# Patient Record
Sex: Male | Born: 1955 | Race: Black or African American | Hispanic: No | Marital: Married | Smoking: Current every day smoker
Health system: Southern US, Community
[De-identification: ages and names within clinical notes are randomized; demographics above are authoritative.]

## PROBLEM LIST (undated history)

## (undated) DIAGNOSIS — T4145XA Adverse effect of unspecified anesthetic, initial encounter: Secondary | ICD-10-CM

## (undated) DIAGNOSIS — R0602 Shortness of breath: Secondary | ICD-10-CM

## (undated) DIAGNOSIS — G4730 Sleep apnea, unspecified: Secondary | ICD-10-CM

## (undated) DIAGNOSIS — J449 Chronic obstructive pulmonary disease, unspecified: Secondary | ICD-10-CM

## (undated) DIAGNOSIS — G4700 Insomnia, unspecified: Secondary | ICD-10-CM

## (undated) DIAGNOSIS — T8859XA Other complications of anesthesia, initial encounter: Secondary | ICD-10-CM

## (undated) DIAGNOSIS — K219 Gastro-esophageal reflux disease without esophagitis: Secondary | ICD-10-CM

## (undated) DIAGNOSIS — E785 Hyperlipidemia, unspecified: Secondary | ICD-10-CM

## (undated) DIAGNOSIS — I10 Essential (primary) hypertension: Secondary | ICD-10-CM

## (undated) DIAGNOSIS — F419 Anxiety disorder, unspecified: Secondary | ICD-10-CM

## (undated) HISTORY — DX: Morbid (severe) obesity due to excess calories: E66.01

## (undated) HISTORY — PX: JOINT REPLACEMENT: SHX530

## (undated) HISTORY — DX: Insomnia, unspecified: G47.00

## (undated) HISTORY — PX: BACK SURGERY: SHX140

---

## 1999-09-29 ENCOUNTER — Emergency Department (HOSPITAL_COMMUNITY): Admission: EM | Admit: 1999-09-29 | Discharge: 1999-09-29 | Payer: Self-pay | Admitting: Emergency Medicine

## 1999-11-02 ENCOUNTER — Encounter: Admission: RE | Admit: 1999-11-02 | Discharge: 1999-11-02 | Payer: Self-pay | Admitting: *Deleted

## 1999-11-09 ENCOUNTER — Encounter: Payer: Self-pay | Admitting: General Surgery

## 1999-11-10 ENCOUNTER — Ambulatory Visit (HOSPITAL_COMMUNITY): Admission: RE | Admit: 1999-11-10 | Discharge: 1999-11-11 | Payer: Self-pay | Admitting: General Surgery

## 1999-11-10 ENCOUNTER — Encounter (INDEPENDENT_AMBULATORY_CARE_PROVIDER_SITE_OTHER): Payer: Self-pay | Admitting: *Deleted

## 2001-06-08 ENCOUNTER — Encounter: Payer: Self-pay | Admitting: Emergency Medicine

## 2001-06-08 ENCOUNTER — Emergency Department (HOSPITAL_COMMUNITY): Admission: EM | Admit: 2001-06-08 | Discharge: 2001-06-08 | Payer: Self-pay | Admitting: Emergency Medicine

## 2001-10-03 ENCOUNTER — Emergency Department (HOSPITAL_COMMUNITY): Admission: EM | Admit: 2001-10-03 | Discharge: 2001-10-03 | Payer: Self-pay | Admitting: *Deleted

## 2002-03-12 ENCOUNTER — Other Ambulatory Visit (HOSPITAL_COMMUNITY): Admission: RE | Admit: 2002-03-12 | Discharge: 2002-03-17 | Payer: Self-pay | Admitting: Psychiatry

## 2003-02-20 ENCOUNTER — Ambulatory Visit (HOSPITAL_COMMUNITY): Admission: RE | Admit: 2003-02-20 | Discharge: 2003-02-20 | Payer: Self-pay | Admitting: Endocrinology

## 2003-02-20 ENCOUNTER — Encounter: Payer: Self-pay | Admitting: Endocrinology

## 2003-04-23 ENCOUNTER — Encounter: Admission: RE | Admit: 2003-04-23 | Discharge: 2003-04-23 | Payer: Self-pay | Admitting: Neurological Surgery

## 2003-04-23 ENCOUNTER — Encounter: Payer: Self-pay | Admitting: Radiology

## 2003-04-23 ENCOUNTER — Encounter: Payer: Self-pay | Admitting: Neurological Surgery

## 2003-07-17 ENCOUNTER — Ambulatory Visit (HOSPITAL_BASED_OUTPATIENT_CLINIC_OR_DEPARTMENT_OTHER): Admission: RE | Admit: 2003-07-17 | Discharge: 2003-07-17 | Payer: Self-pay | Admitting: Endocrinology

## 2003-08-23 ENCOUNTER — Encounter: Payer: Self-pay | Admitting: Neurological Surgery

## 2003-08-23 ENCOUNTER — Encounter: Admission: RE | Admit: 2003-08-23 | Discharge: 2003-08-23 | Payer: Self-pay | Admitting: Neurological Surgery

## 2003-09-30 ENCOUNTER — Encounter: Payer: Self-pay | Admitting: Neurological Surgery

## 2003-09-30 ENCOUNTER — Encounter: Admission: RE | Admit: 2003-09-30 | Discharge: 2003-09-30 | Payer: Self-pay | Admitting: Neurological Surgery

## 2004-01-18 ENCOUNTER — Encounter: Admission: RE | Admit: 2004-01-18 | Discharge: 2004-01-18 | Payer: Self-pay | Admitting: Neurological Surgery

## 2004-12-04 ENCOUNTER — Ambulatory Visit (HOSPITAL_COMMUNITY): Admission: RE | Admit: 2004-12-04 | Discharge: 2004-12-04 | Payer: Self-pay | Admitting: Orthopedic Surgery

## 2004-12-28 ENCOUNTER — Inpatient Hospital Stay (HOSPITAL_COMMUNITY): Admission: RE | Admit: 2004-12-28 | Discharge: 2004-12-29 | Payer: Self-pay | Admitting: Orthopedic Surgery

## 2005-01-30 ENCOUNTER — Encounter (INDEPENDENT_AMBULATORY_CARE_PROVIDER_SITE_OTHER): Payer: Self-pay | Admitting: *Deleted

## 2005-01-30 ENCOUNTER — Inpatient Hospital Stay (HOSPITAL_COMMUNITY): Admission: RE | Admit: 2005-01-30 | Discharge: 2005-02-04 | Payer: Self-pay | Admitting: Orthopedic Surgery

## 2006-01-17 ENCOUNTER — Ambulatory Visit: Payer: Self-pay | Admitting: Physical Medicine & Rehabilitation

## 2006-01-17 ENCOUNTER — Inpatient Hospital Stay (HOSPITAL_COMMUNITY): Admission: RE | Admit: 2006-01-17 | Discharge: 2006-01-24 | Payer: Self-pay | Admitting: Orthopedic Surgery

## 2007-11-16 ENCOUNTER — Encounter: Admission: RE | Admit: 2007-11-16 | Discharge: 2007-11-16 | Payer: Self-pay | Admitting: Orthopedic Surgery

## 2007-11-26 ENCOUNTER — Ambulatory Visit: Payer: Self-pay | Admitting: Cardiology

## 2007-12-16 ENCOUNTER — Inpatient Hospital Stay (HOSPITAL_COMMUNITY): Admission: RE | Admit: 2007-12-16 | Discharge: 2007-12-31 | Payer: Self-pay | Admitting: Orthopedic Surgery

## 2007-12-16 ENCOUNTER — Ambulatory Visit: Payer: Self-pay | Admitting: Critical Care Medicine

## 2007-12-16 ENCOUNTER — Encounter (INDEPENDENT_AMBULATORY_CARE_PROVIDER_SITE_OTHER): Payer: Self-pay | Admitting: Orthopedic Surgery

## 2008-06-01 IMAGING — CR DG LUMBAR SPINE COMPLETE 4+V
1 series · 1 of 1 positions shown · non-contrast
Comparison: MRI 11/16/07.

CLINICAL DATA: L3-4 laminectomy, TLIF L3-4.  
 LUMBAR SPINE FILMS ? 12/16/07:

[view not recorded]
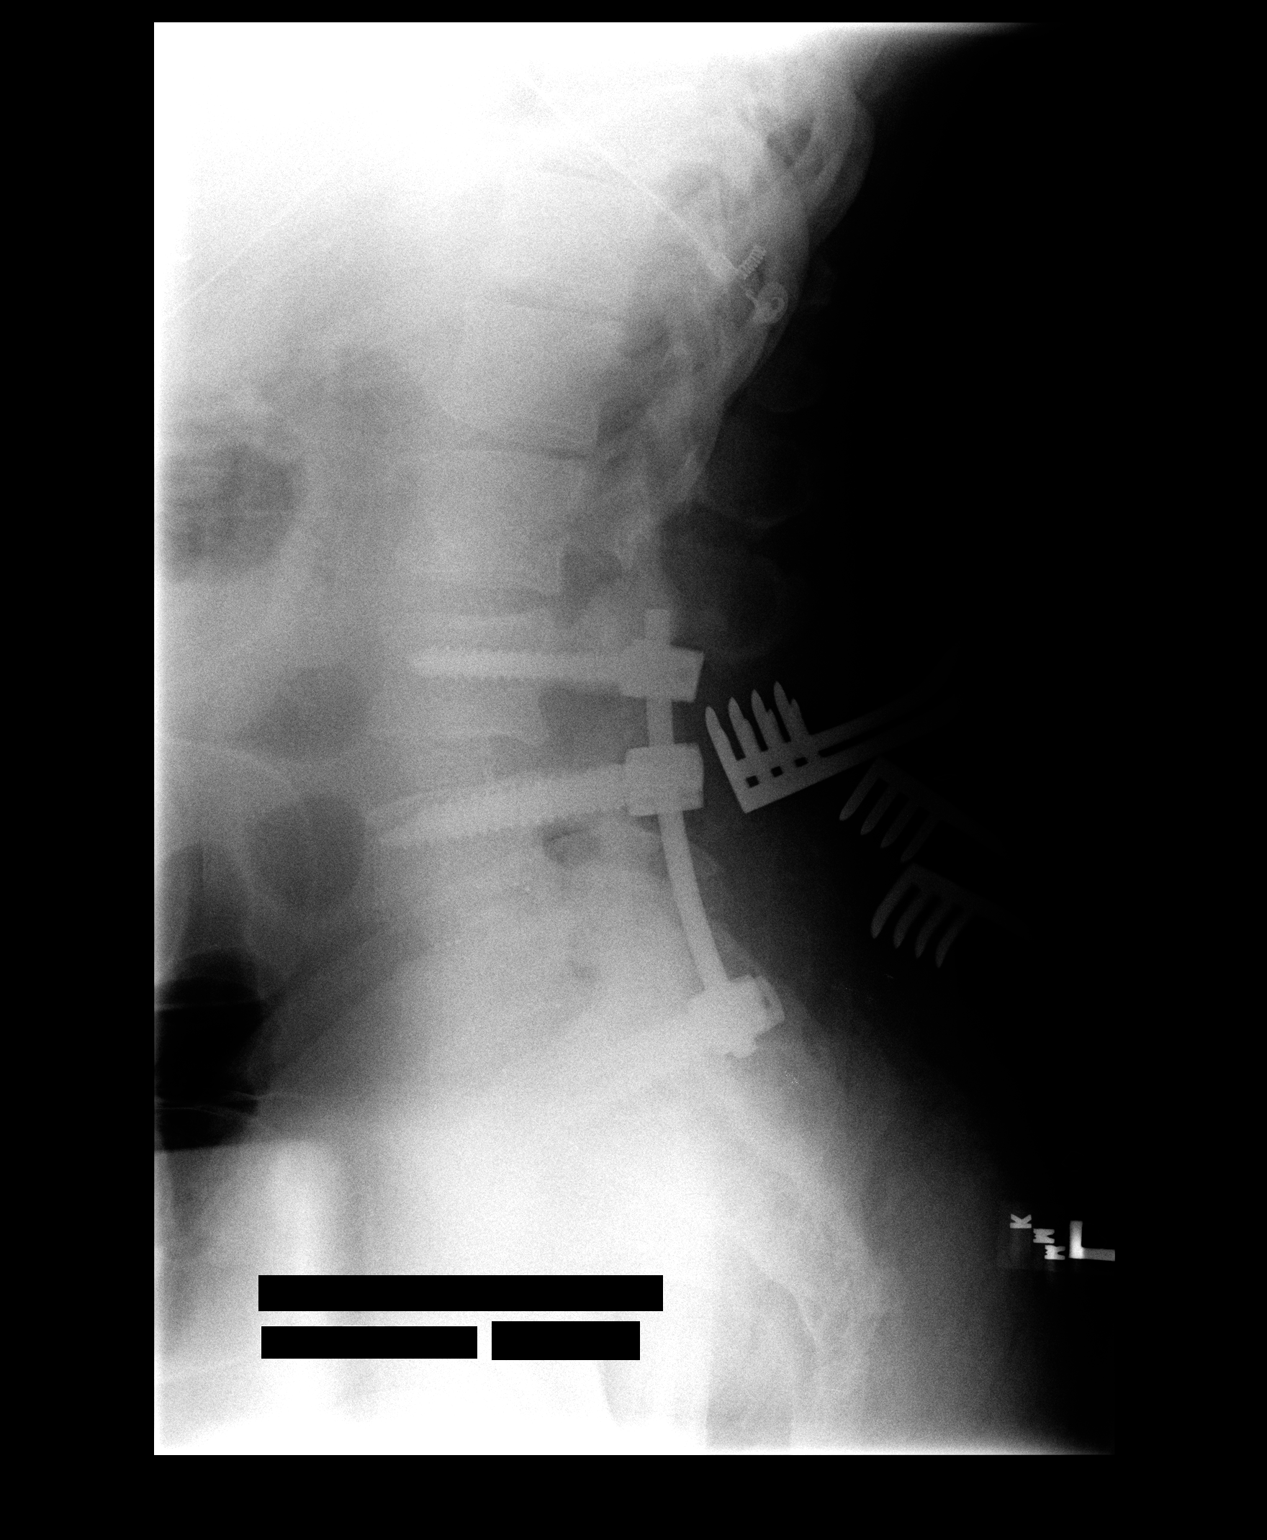

[1 of 1 positions shown; findings below may reference images not displayed]

FINDINGS: Initial film demonstrates a clamp over the L3 spinous process and posterior lumbar interbody fusion from L4 through S1.  Subsequent images demonstrates removal of the posterior lumbar interbody fusion hardware from L3 through S1 and placement of wires presumably within the pedicles at L2 and L3.  
 Final image demonstrates posterior lumbar interbody fusion from L3 to S1 with no pedicle screws at L5-S1.  Interbody bone graft appears present at L3-L4 and L4-L5 interspace.
IMPRESSION: Revision of posterior lumbar interbody fusion, now extending from L3 through S1.

## 2008-06-01 IMAGING — CR DG CHEST 1V PORT
1 series · 1 of 1 positions shown · non-contrast
Comparison: 12/12/07.

CLINICAL DATA: Respiratory distress.
 PORTABLE CHEST - 1 VIEW, 12/16/07, 5593 HOURS:

[AP]
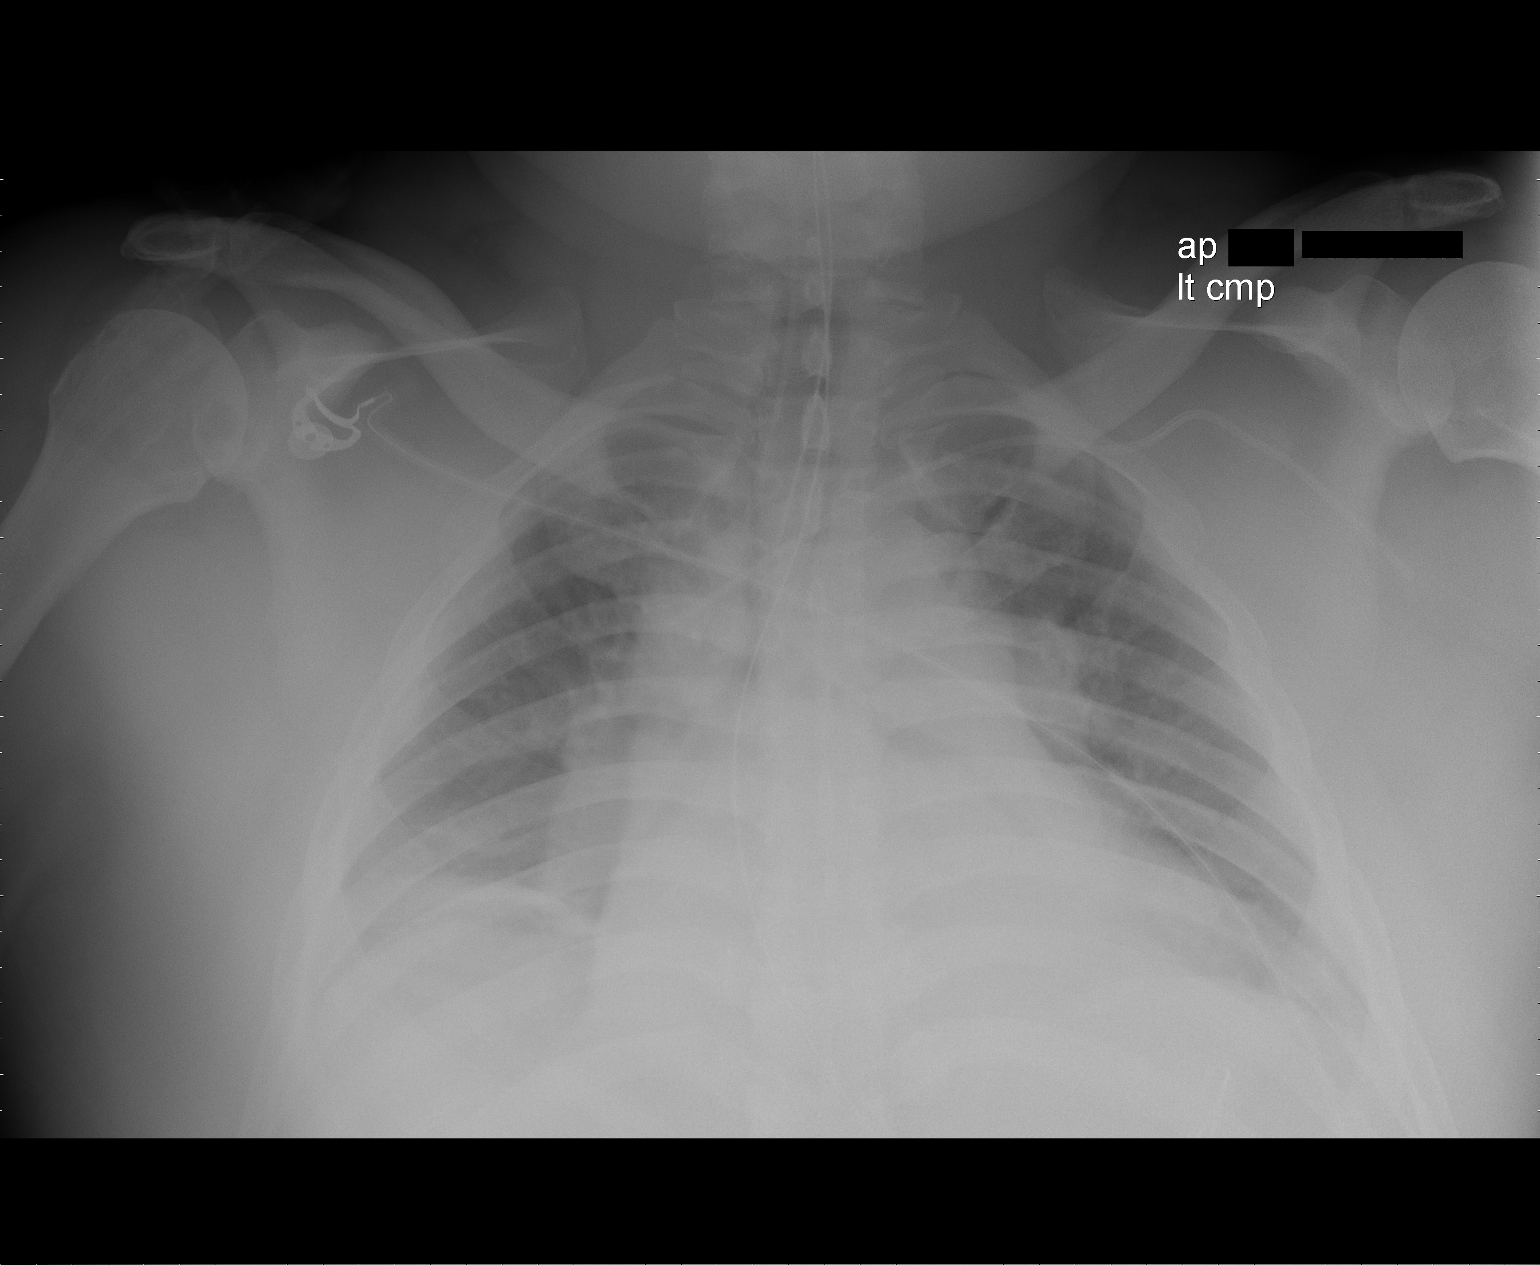

[1 of 1 positions shown; findings below may reference images not displayed]

FINDINGS: Endotracheal tube tip 4 cm above carina.  Left subclavian central line directed towards the lateral wall of the proximal SVC.  No pneumothorax.  Low lung volumes with resultant crowding of bronchovascular structures.  Cannot exclude mild pulmonary vascular congestion.  There is some bibasilar atelectasis or consolidation.  No definite effusion.  Heart size upper limits of normal.  Nasogastric tube extends into the stomach.
IMPRESSION: 1.  Support hardware in expected location.  
 2.  Low lung volumes with bibasilar atelectasis or consolidation and possible central pulmonary vascular congestion.

## 2008-08-09 ENCOUNTER — Encounter: Admission: RE | Admit: 2008-08-09 | Discharge: 2008-08-09 | Payer: Self-pay | Admitting: Orthopedic Surgery

## 2008-11-05 ENCOUNTER — Encounter
Admission: RE | Admit: 2008-11-05 | Discharge: 2009-02-03 | Payer: Self-pay | Admitting: Physical Medicine & Rehabilitation

## 2008-11-08 ENCOUNTER — Ambulatory Visit: Payer: Self-pay | Admitting: Physical Medicine & Rehabilitation

## 2008-12-07 ENCOUNTER — Ambulatory Visit: Payer: Self-pay | Admitting: Physical Medicine & Rehabilitation

## 2009-01-07 ENCOUNTER — Ambulatory Visit: Payer: Self-pay | Admitting: Physical Medicine & Rehabilitation

## 2009-01-11 ENCOUNTER — Encounter
Admission: RE | Admit: 2009-01-11 | Discharge: 2009-02-15 | Payer: Self-pay | Admitting: Physical Medicine & Rehabilitation

## 2009-02-02 ENCOUNTER — Encounter
Admission: RE | Admit: 2009-02-02 | Discharge: 2009-05-02 | Payer: Self-pay | Admitting: Physical Medicine & Rehabilitation

## 2009-02-04 ENCOUNTER — Ambulatory Visit: Payer: Self-pay | Admitting: Physical Medicine & Rehabilitation

## 2009-02-27 ENCOUNTER — Ambulatory Visit (HOSPITAL_BASED_OUTPATIENT_CLINIC_OR_DEPARTMENT_OTHER)
Admission: RE | Admit: 2009-02-27 | Discharge: 2009-02-27 | Payer: Self-pay | Admitting: Physical Medicine & Rehabilitation

## 2009-02-27 ENCOUNTER — Encounter: Payer: Self-pay | Admitting: Internal Medicine

## 2009-03-04 ENCOUNTER — Ambulatory Visit: Payer: Self-pay | Admitting: Physical Medicine & Rehabilitation

## 2009-03-05 ENCOUNTER — Ambulatory Visit: Payer: Self-pay | Admitting: Internal Medicine

## 2009-04-11 ENCOUNTER — Ambulatory Visit: Payer: Self-pay | Admitting: Physical Medicine & Rehabilitation

## 2009-05-02 ENCOUNTER — Inpatient Hospital Stay (HOSPITAL_COMMUNITY): Admission: RE | Admit: 2009-05-02 | Discharge: 2009-05-06 | Payer: Self-pay | Admitting: Orthopedic Surgery

## 2009-05-02 ENCOUNTER — Encounter
Admission: RE | Admit: 2009-05-02 | Discharge: 2009-07-31 | Payer: Self-pay | Admitting: Physical Medicine & Rehabilitation

## 2009-05-11 ENCOUNTER — Ambulatory Visit: Payer: Self-pay | Admitting: Physical Medicine & Rehabilitation

## 2009-05-19 ENCOUNTER — Ambulatory Visit: Payer: Self-pay | Admitting: Internal Medicine

## 2009-05-19 DIAGNOSIS — G4733 Obstructive sleep apnea (adult) (pediatric): Secondary | ICD-10-CM | POA: Insufficient documentation

## 2009-05-22 DIAGNOSIS — I10 Essential (primary) hypertension: Secondary | ICD-10-CM | POA: Insufficient documentation

## 2009-05-22 DIAGNOSIS — E785 Hyperlipidemia, unspecified: Secondary | ICD-10-CM

## 2009-05-22 DIAGNOSIS — E119 Type 2 diabetes mellitus without complications: Secondary | ICD-10-CM | POA: Insufficient documentation

## 2009-05-25 ENCOUNTER — Encounter: Payer: Self-pay | Admitting: Internal Medicine

## 2009-06-05 ENCOUNTER — Encounter: Payer: Self-pay | Admitting: Internal Medicine

## 2009-06-08 ENCOUNTER — Ambulatory Visit: Payer: Self-pay | Admitting: Physical Medicine & Rehabilitation

## 2009-06-22 ENCOUNTER — Encounter: Payer: Self-pay | Admitting: Internal Medicine

## 2009-07-08 ENCOUNTER — Ambulatory Visit: Payer: Self-pay | Admitting: Physical Medicine & Rehabilitation

## 2009-07-26 ENCOUNTER — Ambulatory Visit: Payer: Self-pay | Admitting: Internal Medicine

## 2009-07-28 ENCOUNTER — Encounter: Payer: Self-pay | Admitting: Internal Medicine

## 2009-08-02 ENCOUNTER — Encounter
Admission: RE | Admit: 2009-08-02 | Discharge: 2009-10-31 | Payer: Self-pay | Admitting: Physical Medicine & Rehabilitation

## 2009-08-09 ENCOUNTER — Ambulatory Visit: Payer: Self-pay | Admitting: Physical Medicine & Rehabilitation

## 2009-08-24 ENCOUNTER — Encounter: Payer: Self-pay | Admitting: Internal Medicine

## 2009-09-07 ENCOUNTER — Telehealth: Payer: Self-pay | Admitting: Internal Medicine

## 2009-09-09 ENCOUNTER — Ambulatory Visit: Payer: Self-pay | Admitting: Physical Medicine & Rehabilitation

## 2009-10-10 ENCOUNTER — Ambulatory Visit: Payer: Self-pay | Admitting: Physical Medicine & Rehabilitation

## 2009-10-13 ENCOUNTER — Ambulatory Visit: Payer: Self-pay | Admitting: Internal Medicine

## 2009-11-02 ENCOUNTER — Encounter
Admission: RE | Admit: 2009-11-02 | Discharge: 2009-12-14 | Payer: Self-pay | Admitting: Physical Medicine & Rehabilitation

## 2009-11-09 ENCOUNTER — Ambulatory Visit: Payer: Self-pay | Admitting: Physical Medicine & Rehabilitation

## 2009-12-09 ENCOUNTER — Ambulatory Visit: Payer: Self-pay | Admitting: Physical Medicine & Rehabilitation

## 2009-12-27 ENCOUNTER — Telehealth: Payer: Self-pay | Admitting: Internal Medicine

## 2009-12-30 ENCOUNTER — Ambulatory Visit (HOSPITAL_COMMUNITY): Admission: RE | Admit: 2009-12-30 | Discharge: 2009-12-30 | Payer: Self-pay | Admitting: Orthopedic Surgery

## 2010-01-09 ENCOUNTER — Encounter
Admission: RE | Admit: 2010-01-09 | Discharge: 2010-04-09 | Payer: Self-pay | Admitting: Physical Medicine & Rehabilitation

## 2010-01-10 ENCOUNTER — Ambulatory Visit: Payer: Self-pay | Admitting: Physical Medicine & Rehabilitation

## 2010-02-09 ENCOUNTER — Ambulatory Visit: Payer: Self-pay | Admitting: Physical Medicine & Rehabilitation

## 2010-03-13 ENCOUNTER — Ambulatory Visit: Payer: Self-pay | Admitting: Physical Medicine & Rehabilitation

## 2010-04-05 ENCOUNTER — Ambulatory Visit: Payer: Self-pay | Admitting: Physical Medicine & Rehabilitation

## 2010-04-06 ENCOUNTER — Encounter: Admission: RE | Admit: 2010-04-06 | Discharge: 2010-04-06 | Payer: Self-pay | Admitting: Orthopedic Surgery

## 2010-05-02 ENCOUNTER — Encounter
Admission: RE | Admit: 2010-05-02 | Discharge: 2010-06-08 | Payer: Self-pay | Admitting: Physical Medicine & Rehabilitation

## 2010-05-04 ENCOUNTER — Ambulatory Visit: Payer: Self-pay | Admitting: Physical Medicine & Rehabilitation

## 2010-06-08 ENCOUNTER — Ambulatory Visit: Payer: Self-pay | Admitting: Physical Medicine & Rehabilitation

## 2010-08-11 ENCOUNTER — Encounter
Admission: RE | Admit: 2010-08-11 | Discharge: 2010-10-31 | Payer: Self-pay | Admitting: Physical Medicine & Rehabilitation

## 2010-08-14 ENCOUNTER — Ambulatory Visit: Payer: Self-pay | Admitting: Cardiology

## 2010-08-16 ENCOUNTER — Ambulatory Visit: Payer: Self-pay | Admitting: Physical Medicine & Rehabilitation

## 2010-08-17 ENCOUNTER — Telehealth (INDEPENDENT_AMBULATORY_CARE_PROVIDER_SITE_OTHER): Payer: Self-pay | Admitting: *Deleted

## 2010-08-21 ENCOUNTER — Encounter (HOSPITAL_COMMUNITY): Admission: RE | Admit: 2010-08-21 | Discharge: 2010-09-14 | Payer: Self-pay | Admitting: Cardiology

## 2010-08-21 ENCOUNTER — Encounter: Payer: Self-pay | Admitting: Cardiology

## 2010-08-21 ENCOUNTER — Ambulatory Visit: Payer: Self-pay

## 2010-08-21 ENCOUNTER — Ambulatory Visit: Payer: Self-pay | Admitting: Cardiology

## 2010-08-23 ENCOUNTER — Ambulatory Visit (HOSPITAL_COMMUNITY): Admission: RE | Admit: 2010-08-23 | Discharge: 2010-08-23 | Payer: Self-pay | Admitting: Cardiology

## 2010-08-23 ENCOUNTER — Ambulatory Visit: Payer: Self-pay | Admitting: Cardiology

## 2010-09-14 ENCOUNTER — Ambulatory Visit: Payer: Self-pay | Admitting: Physical Medicine & Rehabilitation

## 2010-10-05 ENCOUNTER — Ambulatory Visit: Payer: Self-pay | Admitting: Physical Medicine & Rehabilitation

## 2010-10-31 ENCOUNTER — Encounter
Admission: RE | Admit: 2010-10-31 | Discharge: 2010-12-07 | Payer: Self-pay | Source: Home / Self Care | Attending: Physical Medicine & Rehabilitation | Admitting: Physical Medicine & Rehabilitation

## 2010-11-06 ENCOUNTER — Ambulatory Visit: Payer: Self-pay | Admitting: Physical Medicine & Rehabilitation

## 2010-11-07 ENCOUNTER — Ambulatory Visit: Payer: Self-pay | Admitting: Internal Medicine

## 2010-12-07 ENCOUNTER — Ambulatory Visit: Payer: Self-pay | Admitting: Physical Medicine & Rehabilitation

## 2010-12-12 ENCOUNTER — Encounter
Admission: RE | Admit: 2010-12-12 | Discharge: 2010-12-12 | Payer: Self-pay | Source: Home / Self Care | Attending: Endocrinology | Admitting: Endocrinology

## 2011-01-04 ENCOUNTER — Ambulatory Visit: Admit: 2011-01-04 | Payer: Self-pay | Admitting: Physical Medicine & Rehabilitation

## 2011-01-14 ENCOUNTER — Encounter: Payer: Self-pay | Admitting: Orthopedic Surgery

## 2011-01-23 NOTE — Assessment & Plan Note (Signed)
Summary: rov//mbw   Copy to:  Riley Kill Primary Provider/Referring Provider:  Lucianne Muss  CC:  Follow up visit-sleep; needs new mask.Marland Kitchen  History of Present Illness: 05/19/09- New Sleep medicine consult coutesy of Dr Riley Kill for this 55 yo man with sleep apnea.  I had seen him remotely for insomnia complaints. He feels restless at night and wife reports snoring with apneas.  Bedtime 2-3 AM with latency 1 hour, waking 3-4 x/ night before getting up at [facility].  NPSG 02/27/09- AHI 8.3/hr. Has had tonsilectomy, hx hypertension.  07/26/09- OSA , DM, HTN Sweaty with summer heat- making it harder to keep cpap headband on. He got haircut, installed ceiling fan, tried several different masks. Estimates now able to use it 3-4 hrs/ night. He has turned in download  card, but is still using autotitration. He feels pressure is too low. One temazepam 15 mg helped- needs refill.   October 13, 2009- OSA, DM, HTN Sweats at night, soaks through his cpap strap- blames his blood sugar. Good compliance every night with CPAP. This gives 2-3 hours of sleep, then waking 3-4x/ night needing to urinate He tries to get 5 hours of cpap. Temazepam 15 x 2 gives some help.but wakes too early.  November 07, 2010-  OSA, DM, HTN Nurse-CC:  Follow up visit-sleep; needs new mask. Had done well with CPAP 13 from [facility]. Was using it every night until old mask began to leak.  Still c/o sweating heavily in sleep - soaks through bed, can't wear mask comfortably. This has frustrated him significantly. He hasn't been told if it reflects diabetic autonomic dysfunction.  Preventive Screening-Counseling & Management  Alcohol-Tobacco     Smoking Status: current     Smoke Cessation Stage: precontemplative     Packs/Day: 0.5     Tobacco Counseling: to quit use of tobacco products  Current Medications (verified): 1)  Amaryl 4 Mg Tabs (Glimepiride) .... Take 1 By Mouth Two Times A Day 2)  Glucophage 1000 Mg Tabs (Metformin Hcl) .... Take 1 By  Mouth Two Times A Day 3)  Humalog 100 Unit/ml Soln (Insulin Lispro (Human)) .Marland Kitchen.. 15 Units Three Times A Day (Each Meal) 4)  Fenofibrate Micronized 200 Mg Caps (Fenofibrate Micronized) .... Take 1 By Mouth Once Daily 5)  Norvasc 10 Mg Tabs (Amlodipine Besylate) .... Take 1 By Mouth At Bedtime 6)  Clonidine Hcl 0.1 Mg Tabs (Clonidine Hcl) .... Take 1 By Mouth Three Times A Day 7)  Lasix 20 Mg Tabs (Furosemide) .... Take 1 By Mouth Once Daily As Needed 8)  Buspar 15 Mg Tabs (Buspirone Hcl) .... Take 1 By Mouth Once Daily As Needed 9)  Clonazepam 0.5 Mg Tabs (Clonazepam) .Marland Kitchen.. 1-2 For Sleep As Needed 10)  Cpap 13 [facility] 11)  First-Testosterone Mc 2 % Crea (Testosterone Propionate) .... Apply Once Daily 12)  Ms Contin 60 Mg Xr12h-Tab (Morphine Sulfate) .... Take 1 Three Times A Day 13)  Hyzaar 100-25 Mg Tabs (Losartan Potassium-Hctz) .... Take 1 By Mouth Once Daily  Allergies: 1)  ! * Actos 2)  ! Verapamil 3)  ! * Lovaza 4)  ! * Antara 5)  ! * Neurotin 6)  ! * Lyrica 7)  ! * Byetta 8)  ! Lopid 9)  ! * Tricor 10)  ! * Januvia 11)  ! * Chantix 12)  ! * Prosom 13)  ! * Viagra 14)  ! Lisinopril 15)  ! * Glyset 16)  ! Wellbutrin 17)  ! * Uniretic 18)  ! *  Zestoretic 19)  ! Elavil 20)  ! * Ziac 21)  ! * Klonipin 22)  ! Dyazide 23)  ! * Nortriptyline 24)  ! * Mobic 25)  ! Cardura  Past History:  Past Medical History: Last updated: 05/19/2009 Obstrucitive Sleep Apnea- NPSG 02/27/09- AHI 8.3/hr Diabetes, Type 2 Hyperlipidemia Hypertension  Past Surgical History: Last updated: 05/19/2009 Tonsillectomy Bilateral hip surgery Cholecystectomy Lumbar disk surgery  Family History: Last updated: 05/19/2009 Mother- allergies  Social History: Last updated: 05/19/2009 Married Disabled Location manager  Risk Factors: Smoking Status: current (11/07/2010) Packs/Day: 0.5 (11/07/2010)  Social History: Smoking Status:  current Packs/Day:  0.5  Review of Systems      See  HPI  The patient denies shortness of breath with activity, shortness of breath at rest, productive cough, non-productive cough, coughing up blood, chest pain, irregular heartbeats, acid heartburn, indigestion, loss of appetite, weight change, abdominal pain, difficulty swallowing, sore throat, tooth/dental problems, headaches, nasal congestion/difficulty breathing through nose, sneezing, itching, ear ache, hand/feet swelling, rash, change in color of mucus, and fever.    Vital Signs:  Patient profile:   55 year old male Height:      63 inches Weight:      229 pounds BMI:     40.71 O2 Sat:      95 % on Room air Pulse rate:   109 / minute BP sitting:   140 / 82  (left arm) Cuff size:   large  Vitals Entered By: Reynaldo Minium CMA (November 07, 2010 3:15 PM)  O2 Flow:  Room air CC: Follow up visit-sleep; needs new mask.   Physical Exam  Additional Exam:  General: A/Ox3; pleasant and cooperative, NAD, obese SKIN: no rash, lesions. Diaphoretic around face, not trunk or axillae NODES: no lymphadenopathy HEENT: [place]/AT, EOM- WNL, Conjuctivae- clear, PERRLA, TM-WNL, Nose- clear, Throat- clear and wnl, Mallampati III-IV NECK: Supple w/ fair ROM, JVD- none, normal carotid impulses w/o bruits Thyroid-  CHEST: Clear to P&A HEART: RRR, no m/g/r heard ABDOMEN: Soft and nl; UUV:OZDG, nl pulses, no edema, thin legs, walks with cane  NEURO: Grossly intact to observation      Impression & Recommendations:  Problem # 1:  OBSTRUCTIVE SLEEP APNEA (ICD-327.23)  CPAP control seems to have been very good until now that he needs a replacement mask. His diabetes is likely causing autonomic instability to cause really bothersome sweating that dsiturbs his sleep. I discussed options, will refill clonazepam, and suggested he consider a dehumidifier for his bedroom. I don't know what medical interventions exist for the diaphoresis.   Medications Added to Medication List This Visit: 1)  Humalog 100  Unit/ml Soln (Insulin lispro (human)) .Marland Kitchen.. 15 units three times a day (each meal) 2)  Clonidine Hcl 0.1 Mg Tabs (Clonidine hcl) .... Take 1 by mouth three times a day 3)  Clonazepam 1 Mg Tabs (Clonazepam) .Marland Kitchen.. 1 for sleep as needed 4)  First-testosterone Mc 2 % Crea (Testosterone propionate) .... Apply once daily 5)  Ms Contin 60 Mg Xr12h-tab (Morphine sulfate) .... Take 1 three times a day 6)  Hyzaar 100-25 Mg Tabs (Losartan potassium-hctz) .... Take 1 by mouth once daily 7)  Replacement Cpap Mask of Choice and Supplies   Other Orders: Est. Patient Level III (64403) DME Referral (DME)  Patient Instructions: 1)  Please schedule a follow-up appointment in 1 year. 2)  Dr Lucianne Muss may know some medical treatment for the heavy sweating that I think is related to your diabetes. 3)  It  may help to run a dehumidifier in your bedroom to take moisture out of the air so your sweat evaporates quicker 4)  Replacement CPAP mask- See [facility] Prescriptions: CLONAZEPAM 1 MG TABS (CLONAZEPAM) 1 for sleep as needed  #30 x 5   Entered and Authorized by:   Waymon Budge MD   Signed by:   Waymon Budge MD on 11/07/2010   Method used:   Print then Give to Patient   RxID:   [id number]     Appended Document: rov//mbw Spoke with patient-states he is not allergic to any medications; I have checked with PCP, pharmacy, old paper chart, and CDY-NKDA. I have removed the drug allergies from pts chart in EMR.   Clinical Lists Changes  Allergies: Removed allergy or adverse reaction of * ACTOS Removed allergy or adverse reaction of VERAPAMIL Removed allergy or adverse reaction of * LOVAZA Removed allergy or adverse reaction of * ANTARA Removed allergy or adverse reaction of * NEUROTIN Removed allergy or adverse reaction of * LYRICA Removed allergy or adverse reaction of * BYETTA Removed allergy or adverse reaction of LOPID Removed allergy or adverse reaction of * TRICOR Removed allergy or adverse  reaction of * JANUVIA Removed allergy or adverse reaction of * CHANTIX Removed allergy or adverse reaction of * PROSOM Removed allergy or adverse reaction of * VIAGRA Removed allergy or adverse reaction of LISINOPRIL Removed allergy or adverse reaction of * GLYSET Removed allergy or adverse reaction of WELLBUTRIN Removed allergy or adverse reaction of * UNIRETIC Removed allergy or adverse reaction of * ZESTORETIC Removed allergy or adverse reaction of ELAVIL Removed allergy or adverse reaction of * ZIAC Removed allergy or adverse reaction of * KLONIPIN Removed allergy or adverse reaction of DYAZIDE Removed allergy or adverse reaction of * NORTRIPTYLINE Removed allergy or adverse reaction of * MOBIC Removed allergy or adverse reaction of CARDURA

## 2011-01-23 NOTE — Progress Notes (Signed)
Summary: PRESCRIPT  Phone Note Call from Patient Call back at [phone]   Caller: Patient Call For: Saadiya Wilfong Summary of Call: CALLING FOR REFILL FOR TEMAZEPAM [facility] [place] Initial call taken by: Rickard Patience,  December 27, 2009 1:49 PM  Follow-up for Phone Call        Pt states he would take clonazepam around midnight, but then not fall asleep until 5 or 6 am. He then states he would sleep for 2 hours and then be awake again. he states he would be groggy later in the day when he took clonazepam. Pt request to go back on Temazepam.  Also pt states he feels his CPAP is not working. He is sleeping a total of 4-5 hours a night but these are not consecutive hours of sleep. Pt states he is still falling aslleep during th day while watching tv. Please advise. Carron Curie CMA  December 27, 2009 2:59 PM   Additional Follow-up for Phone Call Additional follow up Details #1::        Refill sent x 5 per CDY-see append on refill request. Called to pharmacy. Reynaldo Minium CMA  December 28, 2009 8:17 AM   Spoke with pt; aware of refill request sent and also that he and CDY will discuss the rest at his next OV in April. Reynaldo Minium CMA  December 28, 2009 8:18 AM

## 2011-01-23 NOTE — Assessment & Plan Note (Signed)
Summary: Cardiology Nuclear Testing  Nuclear Med Background Indications for Stress Test: Evaluation for Ischemia     Symptoms: Dizziness, DOE, Fatigue, Light-Headedness, Rapid HR, SOB  Symptoms Comments: Left Arm Discomfort   Nuclear Pre-Procedure Cardiac Risk Factors: Hypertension, IDDM Type 2, Lipids, Smoker Caffeine/Decaff Intake: None NPO After: 6:00 AM Lungs: clear IV 0.9% NS with Angio Cath: 20g     IV Site: R Antecubital IV Started by: Irean Hong, RN Chest Size (in) 46     Height (in): 63 Weight (lb): 230 BMI: 40.89 Tech Comments: The patient sweaty on arrival with 5 1/2 pc  BS taken=168. The patient denies CP,and he woke up with sore throat today,Temp. 98.9 here. Labetalol held this am dose.  Nuclear Med Study 1 or 2 day study:  1 day     Stress Test Type:  Lexiscan Reading MD:  Willa Rough, MD     Referring MD:  Vonna Drafts Resting Radionuclide:  Technetium 32m Tetrofosmin     Resting Radionuclide Dose:  11 mCi  Stress Radionuclide:  Technetium 70m Tetrofosmin     Stress Radionuclide Dose:  33 mCi   Stress Protocol      Max HR:  100 bpm     Predicted Max HR:  165 bpm  Max Systolic BP: 134 mm Hg     Percent Max HR:  60.61 %Rate Pressure Product:  16109  Lexiscan: 0.4 mg   Stress Test Technologist:  Cathlyn Parsons, RN     Nuclear Technologist:  Domenic Polite, CNMT  Rest Procedure  Myocardial perfusion imaging was performed at rest 45 minutes following the intravenous administration of Technetium 3m Tetrofosmin.  Stress Procedure  The patient received IV Lexiscan 0.4 mg over 15-seconds. Patient became SOB and lightheaded.  Patient developed slight CP x30 secs in recovery stage.  There was no ectopy noted. Technetium 50m Tetrofosmin injected at 30-seconds.  There were no significant changes with infusion.  Quantitative spect images were obtained after a 45 minute delay.  QPS Raw Data Images:  Patient motion noted; appropriate software correction  applied. Stress Images:  Mild decrease in activity in the inferior wall Rest Images:  Same as stress Subtraction (SDS):  No evidence of ischemia. Transient Ischemic Dilatation:  0.93  (Normal <1.22)  Lung/Heart Ratio:  0.35  (Normal <0.45)  Quantitative Gated Spect Images QGS EDV:  112 ml QGS ESV:  47 ml QGS EF:  58 % QGS cine images:  Normal motion  Findings Nondiagnostic      Overall Impression  Exercise Capacity: Lexiscan with no exercise. BP Response: Normal blood pressure response. Clinical Symptoms: SOB ECG Impression: No significant ST segment change suggestive of ischemia. Overall Impression Comments: There is decreased activity in the inferior wall. There is normal motion. There is no ischemia. The findings are most c/w attenuation in the inferior wall. Some scar can not be ruled out.

## 2011-01-23 NOTE — Progress Notes (Signed)
Summary: Nuc Pre-Procedure  Phone Note Outgoing Call Call back at Work Phone [phone]   Call placed by: Antionette Char RN,  August 17, 2010 10:56 AM Call placed to: Patient Reason for Call: Confirm/change Appt Summary of Call: Left message with information on [employer] Information Sheet (see scanned document for details).     Nuclear Med Background Indications for Stress Test: Evaluation for Ischemia     Symptoms: DOE  Symptoms Comments: Left Arm Discomfort   Nuclear Pre-Procedure Cardiac Risk Factors: Hypertension, IDDM Type 2, Lipids, Smoker Height (in): 64

## 2011-01-31 ENCOUNTER — Other Ambulatory Visit (HOSPITAL_COMMUNITY): Payer: Self-pay | Admitting: Orthopedic Surgery

## 2011-01-31 DIAGNOSIS — M25559 Pain in unspecified hip: Secondary | ICD-10-CM

## 2011-02-15 ENCOUNTER — Other Ambulatory Visit (HOSPITAL_COMMUNITY): Payer: Self-pay

## 2011-04-03 LAB — URINALYSIS, ROUTINE W REFLEX MICROSCOPIC
Bilirubin Urine: NEGATIVE
Glucose, UA: 100 mg/dL — AB
Glucose, UA: NEGATIVE mg/dL
Leukocytes, UA: NEGATIVE
Leukocytes, UA: NEGATIVE
Nitrite: NEGATIVE
Protein, ur: >300 mg/dL — AB
Specific Gravity, Urine: 1.027 (ref 1.005–1.030)
Urobilinogen, UA: 1 mg/dL (ref 0.0–1.0)
pH: 5.5 (ref 5.0–8.0)
pH: 5.5 (ref 5.0–8.0)

## 2011-04-03 LAB — BASIC METABOLIC PANEL
CO2: 27 mEq/L (ref 19–32)
Calcium: 9 mg/dL (ref 8.4–10.5)
Chloride: 101 mEq/L (ref 96–112)
Chloride: 103 mEq/L (ref 96–112)
Creatinine, Ser: 1.04 mg/dL (ref 0.4–1.5)
GFR calc Af Amer: >60 mL/min (ref >60)
GFR calc Af Amer: >60 mL/min (ref >60)
GFR calc non Af Amer: >60 mL/min (ref >60)
Potassium: 3.9 mEq/L (ref 3.5–5.1)
Sodium: 136 mEq/L (ref 135–145)

## 2011-04-03 LAB — DIFFERENTIAL
Basophils Relative: 2 % — ABNORMAL HIGH (ref 0–1)
Eosinophils Absolute: 0.1 10*3/uL (ref 0.0–0.7)
Eosinophils Relative: 1 % (ref 0–5)
Lymphocytes Relative: 36 % (ref 12–46)
Monocytes Relative: 5 % (ref 3–12)
Neutrophils Relative %: 56 % (ref 43–77)

## 2011-04-03 LAB — URINE MICROSCOPIC-ADD ON

## 2011-04-03 LAB — CBC
HCT: 30.2 % — ABNORMAL LOW (ref 39.0–52.0)
HCT: 36.1 % — ABNORMAL LOW (ref 39.0–52.0)
HCT: 41.8 % (ref 39.0–52.0)
Hemoglobin: 12.5 g/dL — ABNORMAL LOW (ref 13.0–17.0)
MCHC: 34.7 g/dL (ref 30.0–36.0)
MCHC: 34.7 g/dL (ref 30.0–36.0)
MCHC: 35.2 g/dL (ref 30.0–36.0)
MCV: 88.7 fL (ref 78.0–100.0)
MCV: 89.2 fL (ref 78.0–100.0)
MCV: 89.3 fL (ref 78.0–100.0)
MCV: 89.3 fL (ref 78.0–100.0)
MCV: 89.9 fL (ref 78.0–100.0)
Platelets: 186 10*3/uL (ref 150–400)
Platelets: 207 10*3/uL (ref 150–400)
Platelets: 281 10*3/uL (ref 150–400)
RBC: 3.15 MIL/uL — ABNORMAL LOW (ref 4.22–5.81)
RBC: 4.01 MIL/uL — ABNORMAL LOW (ref 4.22–5.81)
RDW: 13.5 % (ref 11.5–15.5)
RDW: 13.6 % (ref 11.5–15.5)
WBC: 12.7 10*3/uL — ABNORMAL HIGH (ref 4.0–10.5)
WBC: 9 10*3/uL (ref 4.0–10.5)

## 2011-04-03 LAB — GLUCOSE, CAPILLARY
Glucose-Capillary: 115 mg/dL — ABNORMAL HIGH (ref 70–99)
Glucose-Capillary: 155 mg/dL — ABNORMAL HIGH (ref 70–99)
Glucose-Capillary: 160 mg/dL — ABNORMAL HIGH (ref 70–99)
Glucose-Capillary: 186 mg/dL — ABNORMAL HIGH (ref 70–99)
Glucose-Capillary: 195 mg/dL — ABNORMAL HIGH (ref 70–99)
Glucose-Capillary: 207 mg/dL — ABNORMAL HIGH (ref 70–99)
Glucose-Capillary: 224 mg/dL — ABNORMAL HIGH (ref 70–99)
Glucose-Capillary: 266 mg/dL — ABNORMAL HIGH (ref 70–99)

## 2011-04-03 LAB — PROTIME-INR
INR: 1.2 (ref 0.00–1.49)
INR: 1.6 — ABNORMAL HIGH (ref 0.00–1.49)
Prothrombin Time: 12.9 seconds (ref 11.6–15.2)
Prothrombin Time: 19.4 seconds — ABNORMAL HIGH (ref 11.6–15.2)
Prothrombin Time: 20.7 seconds — ABNORMAL HIGH (ref 11.6–15.2)

## 2011-04-03 LAB — CULTURE, BLOOD (ROUTINE X 2)
Culture: NO GROWTH
Culture: NO GROWTH

## 2011-04-03 LAB — TYPE AND SCREEN

## 2011-05-03 ENCOUNTER — Other Ambulatory Visit: Payer: Self-pay | Admitting: Orthopedic Surgery

## 2011-05-03 DIAGNOSIS — T84038A Mechanical loosening of other internal prosthetic joint, initial encounter: Secondary | ICD-10-CM

## 2011-05-08 NOTE — Assessment & Plan Note (Signed)
Lance [name] is back regarding his low back pain.  He is having some low  back symptoms still.  He does not notice a big change with the Opana ER  at this point, but he is having no problems.  He received a shoe buildup  to the orthotic company.  He states it is a little bit different, but he  feels that the gait is more normal.  The patient rates his pain 8/10.  He described as sharp, stabbing, constant aching.  Pain interferes with  general activity, relations with others, enjoyment of life on severe  levels.  He does complain of ongoing problems with sleep and sweats at  night.   REVIEW OF SYSTEMS:  Notable for numbness, tingling, night sweats, sleep  apnea symptoms.  Other pertinent positives above.  Full review is in the  written health and history section of chart.   SOCIAL HISTORY:  Unchanged.  He is married.   PHYSICAL EXAMINATION:  Blood pressure is 165/92, pulse is 86,  respiratory rate 18.  He is sating 96% on room air.  The patient is  pleasant, alert, and oriented x3.  He has a shoe buildup on and he is  much more symmetrical along the iliac crest.  He may be in fact a little  higher in the left, but when he bears weight on the left leg and  walking, the left leg has significant varus deformity.  Back is painful  along the lower lumbar segments in the fusion site.  He has some mild  tenderness along the right buttock.  The left hip and buttock were less  tender I felt today.  Range of motion was similar with about 50-60  degrees of forward flexion, 10-15 degrees of extension, and 25-30  degrees of rotation and lateral bending to either side.  Facet maneuvers  caused some pain.  The patient has some decreased sensation to light  touch in both legs today.  Cognitively, affect is appropriate.  Mood was  overall more upbeat.  Heart was regular.  Chest was clear.  Abdomen was  soft, nontender.   ASSESSMENT:  1. Lower lumbar post laminectomy syndrome.  2. Left leg length  discrepancy.  3. Right greater trochanter bursitis.  4. 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  5. History of non-insulin requiring diabetes.  6. Left lower extremity radiculopathy.  7. Obesity.  8. Anxiety.   PLAN:  1. Continue with shoe buildup for now.  2. We will send the patient to outpatient physical therapy work on      posture, gait, modalities, strengthening of the low back, range of      motion, etc.  3. We will increase Opana ER to 10 mg q.12 h.  4. Norco 10/325 one q.6 h. p.r.n. #120.  5. Continue Neurontin with dosing 600 mg q.i.d.  6. I will see the patient back in about 1 months' time.      Ranelle Oyster, M.D.  Electronically Signed     ZTS/MedQ  D:  01/07/2009 13:27:50  T:  01/08/2009 02:34:02  Job #:  [id number]   cc:   Nelda Severe, MD  Fax: [phone]

## 2011-05-08 NOTE — Op Note (Signed)
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   OPERATIVE PROCEDURE:  L3-4 bilateral laminectomy; L3-4 posterolateral  fusion; L3-4 posterior interbody fusion (PLIF); insertion of interbody  fusion cage from left side at L3-4; removal of bilateral L4, L5, and S1  screws; reinsertion of screws at S1 bilaterally and L4 bilaterally;  insertion of pedicle screws at L3 bilaterally; harvest of right  posterior iliac crest bone graft.   The patient was placed under general endotracheal anesthesia.  A Foley  catheter was placed in the bladder.  Sequential compression devices were  placed on both lower extremities.  He was positioned prone on the  Haven frame.  He had received intravenous antibiotics  prophylactically.  He was positioned so as to avoid hyperflexion and  abduction of the shoulders and so as to avoid hyperflexion of his  elbows.  The upper extremities were padded with foam from axilla to  hands.  Thighs, knees, shins, and ankles were supported on pillows.   Hair was clipped from the lumbar spine.  The skin was scrubbed with  antiseptic soap.  The skin was then prepped with DuraPrep.  Drapes were  applied in square fashion and secured with Ioban.   The skin was incised in an elliptical fashion around his previous  midline incision scar and extended proximally about 3 cm.  The  subcutaneous tissue  was then injected with a mixture of 0.25% plain  lidocaine and 1% lidocaine with epinephrine.  Cutting current was then  used to complete the excision of the previous midline scar and extend  the incision proximally.  Cutting current was then used to cut down onto  the spinous processes, the most distal of which was L3.  Paraspinal  muscles were mobilized bilaterally at L2-3.  The scar was incised with  cutting current in the midline, and the paraspinal muscle and scar  mobilized laterally until fusion mass and pedicle screws and rods could  be identified bilaterally.   We removed bone from around the rod on both sides with old osteotomes.  Set screws were then undone and the rods removed bilaterally.  Screws  were removed on the right side.  We then also removed the screws on the  left side.  A pedicle hole was made at what was perceived to be L3 but  which turned out on a subsequent x-ray to be L2 on the left side, the L3-  4 interval being very short and there being great deal of scar in the  region.  The pedicle  hole was made in the usual fashion, identifying the  base of the transverse process, perforating the posterior pedicle with  an awl, and then using a pedicle probe and drill to make a hole.   Similarly, on the right side hole was made which actually was at the L3  level.  Cross-table lateral radiograph revealed that the left-sided hole  was at L2.  Another hole was made at L3, and a cross-table lateral  radiograph showed satisfactory position of guides.  Each hole was  circumferentially palpated with a ball-tip probe and sounded for depth.  The holes were injected with FloSeal prior to placing the radiopaque  markers.   At this point, we harvested a right posterior iliac crest graft through  a separate oblique incision.  Incision was carried down onto the gluteal  fascia.  The gluteal fascia insertion into the outer ilium was released  with cutting current and the muscle  elevated off the outer table of the  ilium.  A Taylor retractor was placed.  We then harvested a moderately  large amount of bone graft with a combination of acetabular reamers and  sharp gouges.  The bony defect was packed with Gelfoam.  The gluteal  fascia was reattached to the iliac crest using interrupted figure-of-  eight #1 Vicryl sutures.   Next, we placed new pedicle screws at S1 and L4 on the right side.   At this point, we began the laminectomy.  We sized a high-speed bur to  thin out the L3 lamina and do partial facetectomies.  The facetectomies  were completed, as well as lateral recess decompression, and the  laminectomy extended proximally bilaterally with Kerrison rongeurs,  being careful to interpose cottonoid patties between the dura and the  undersurface of the lamina.  The decompression was judged to be  satisfactory when a ball-tip nerve hook was admitted into the L3-4  neural foramen, and the L3-4 neural foramen palpable on both sides.  Lateral recess was completely decompressed at the L4-5 level, including  distal to the L4-5 facet joint, medial to the L4 pedicle bilaterally.  In other words, both L3 and L4 nerve roots were decompressed  bilaterally.   Next, we decorticated the transverse process and lateral aspects of the  superior articular process on the left side of L3, as well as the  proximal part of the posterolateral fusion mass at L4.  We then packed  in posterolateral graft.  A 6.5 mm diameter pedicle screw was then  placed at L3 on the left side.   We then mobilized the dura medially on the left at L3-4 and bipolar  coagulated epidural veins over the lateral aspect of the disc.  The disc  was fenestrated, and we used disc scrapers to start enucleation process.  Ultimately, an intradiskal distractor, 12 mm, was introduced and the  disc space distracted and then locked in position by placing a short  working rod between L3 and L4 on the left side.   Further curettage of  the disc space was then carried out with a variety of curettes and  pituitary rongeurs.  When I felt the endplates had been adequately  prepared, we placed a special funnel in the disk space, packed in a  moderate quantity of cancellous bone into the anterior disc space.   A PEEK TLIF implant was then loaded with cancellous graft and inserted  into place in the transverse position.  At this point, the set screws  were released  to allow compression of the L3-4 disc space.  We then  placed a 100 mm contour rod between S1 distally and L3 proximally,  including the L4 screw.  The set screws were provisionally tightened  snugly, but not torqued.   We then placed new screws at S1 and L4 on the right side in the  preexisting holes.  We decorticated the transverse process and lateral  aspect of the superior articular process at L3 on the right and the  fusion mass at L4.  More bone graft was packed in posterolaterally.  A  6.5 mm diameter screw was then inserted at L3.  Cross-table lateral  radiographs showed satisfactory position of screws and interbody  implant.  We then placed a 90 mm contoured rod from S1 to L4 to L3 on  the right side.  The construct was compressed at L3-4 bilaterally and  all of the couplings torqued.  There was bone graft left over which was  then packed in bilaterally in the posterolateral location.   The laminectomy defect was filled with FloSeal to prevent bone graft  from entering the laminectomy.   A 15-gauge Blake drain was placed subfascially and secured on the skin  with a 2-0 nylon suture.  The closure of the thoracolumbar fascia was  then carried out with continuous and interrupted #1 Vicryl suture.  A  1/8-inch Hemovac drain was brought through both wounds and brought out  through the skin through the right side.  The subcutaneous layer was  closed using 2-0 Vicryl in interrupted and continuous fashion.  The skin  at the bone graft  harvest site was closed using continuous subcuticular  3-0 undyed Vicryl suture, and the skin edges were reinforced with  Dermabond.  The sacrolumbar wound was closed using interrupted vertical  and horizontal mattress sutures of 2-0 nylon.  This was done because of  difficulty inverting the skin edges, as the tissues were not pliable at  all, literally from skin through paraspinal scar and muscle, etc.   The procedure overall was fairly arduous because of the dense scarring  centrally and bilaterally.  There was about 1000 mL of blood loss.  He  received Cell Saver unit blood back.  His blood pressure was stable, but  required a Neo-Synephrine drip throughout the case.   At the time of dictation, I have not examined him in the recovery room  yet.  I contacted the critical care doctor for a consultation.  He will  be transferred to a step-down unit or intensive care unit  postoperatively.   Sponge and needle counts were correct.  No intraoperative complications.      Nelda Severe, MD  Electronically Signed     MT/MEDQ  D:  12/16/2007  T:  12/17/2007  Job:  [id number]

## 2011-05-08 NOTE — Assessment & Plan Note (Signed)
Lance [name] is back regarding his low back pain.  Back has been doing fairly  well.  He saw Dr. Carlean Jews regarding his left hip and now has hip surgery  planned for next month.  He went for a sleep study which revealed mild  obstructive sleep apnea/hypopnea syndrome.  I assume that he was going  to have followup regarding the test, but apparently this was not  arranged.  The patient continues to have some issues with asleep.  He  does not like to use nighttime sleep aids for the most part as he is  afraid of daytime side effects.  He remains on Opana ER 20 mg q.12 h for  baseline pain control, Norco 10 p.r.n.  He is fairly happy with the left  knee brace he is required for his support.  The patient describes pain  as sharp, burning, stabbing, aching, and tingling.  Pain ranges from 8-  10/10 in intensity.  Pain interferes with general activity, relations  with others, and enjoyment of life on a moderate level.  Sleep is poor.   REVIEW OF SYSTEMS:  Notable for numbness, tingling, spasms, night  sweats, high sugars.  Other pertinent positives are above and full  review is in the written health and history section of the chart.   SOCIAL HISTORY:  The patient is married, lives with his wife, and is  still smoking half pack of cigarettes per day.   PHYSICAL EXAMINATION:  VITAL SIGNS:  Blood pressure is 168/94, pulse 70,  respiratory rate 18.  He is sating 95% on room air.  The patient remains  overweight.  GENERAL:  He appears a bit brighter and mobile than he has in the past.  NEUROLOGIC:  He is able to bend a bit farther to the 70-75-degree range.  He is able to extend low back to 15 degrees and rotation approximately  30 degrees bilaterally.  He has some tenderness over the lower lumbar  spine.  He had decreased pinprick and light touch in both legs distally.  Reflexes are 1+.  Strength is generally 4-5/5 in both legs.  The upper  extremity strength is within normal limits.  Cognitively, he is  intact  with good insight and awareness.  HEART:  Regular.  CHEST:  Generally clear.  Occasional wheezing.  ABDOMEN:  Soft, nontender.   ASSESSMENT:  1. Lumbar post-laminectomy syndrome.  2. 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  3. Right greater trochanter bursitis.  4. 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  The      patient now awaiting a left hip replacement.  5. History of non-insulin requiring diabetes.  6. Left lower extremity radiculopathy.  7. Obesity.  8. Anxiety.  9. Insomnia/questionable sleep apnea.   PLAN:  1. We will refer the patient to Dr. Jetty Duhamel for assessment of      his sleep apnea/hypopnea syndrome.  It appears that he needs a      trial CPAP.  The patient is very interested in pursuing any avenue      to improve his sleep and certainly showed genuine interest when we      discussed this matter today.  He also needs to stop smoking.  2. Continue with exercise and range of motion at home.  3. Continue with Opana ER 20 mg q.12 h with Norco 10/325 for      breakthrough pain.  4. TENS unit for low back and hinged left knee brace for the  left      knee.  5. I will see him back in about 3 months with nursing followup in 1      month.      Ranelle Oyster, M.D.  Electronically Signed     ZTS/MedQ  D:  04/11/2009 12:16:05  T:  04/12/2009 03:21:59  Job #:  [id number]   cc:   Nelda Severe, MD  Fax: [phone]

## 2011-05-08 NOTE — Consult Note (Signed)
NAME:  Lance [name], Lance [name] NO.:  [id number]   MEDICAL RECORD NO.:  [id number]          PATIENT TYPE:  INP   LOCATION:  3108                         FACILITY:  [facility]   PHYSICIAN:  Charlcie Cradle. Delford Field, MD, FCCPDATE OF BIRTH:  [birth year]   DATE OF CONSULTATION:  DATE OF DISCHARGE:                                 CONSULTATION   CHIEF COMPLAINT:  Shock __________  status post lumbar surgery.   HISTORY OF PRESENT ILLNESS:  This is a 55 year old African American  male, obese, diabetes, and hypertensive who underwent L3 to L5 posterior  spine fusion and laminectomy.  The patient had L3-4 stenosis and  spondylosis.  He is __________  interop and postop and also it was a  difficult intubation.  The patient is on mechanical vent support  __________  postoperative care.  He has a large, edematous tongue and it  was a difficult intubation.   PAST MEDICAL HISTORY:  Medical history of diabetes, hypertension,  obesity.   MEDICATIONS PRIOR TO ADMISSION:  1. Neurontin.  2. Norvasc.  3. Amaryl.  4. Lisinopril.  5. HCT.  6. Doxazosin.  7. BuSpar.  8. Labetalol.  9. Metformin.  10.NovoLog.  11.__________ .   SOCIAL HISTORY:  Noncontributory.   FAMILY HISTORY:  Noncontributory.   REVIEW OF SYSTEMS:  Noncontributory.   PHYSICAL EXAMINATION:  VITAL SIGNS:  Temperature __________ .  The  patient's __________  saturation 96%.  CHEST:  Showed distant breath sounds with prolonged expiratory phase.  No wheeze or rhonchi noted.  CARDIAC:  Showed a regular rate and rhythm No  S3 Normal S2.  ABDOMEN:  Soft, protuberant.  EXTREMITIES:  Showed no edema.  There was poor perfusion.  NEUROLOGIC:  Patient sedated on the vent.   LABORATORIES:  Pending at time of this dictation.   IMPRESSION:  Patient was out of shock state status post lumbar spine  laminectomy and fusion with some blood loss and associated oliguria.  The patient is likely volume depleted at this time and may have  coagulopathy.   PLAN:  Full vent support, give volume, place central line, transfuse 2  units packed cells, administer sedation protocol and admit to the  intensive care unit.      Charlcie Cradle Delford Field, MD, [facility]  Electronically Signed     PEW/MEDQ  D:  12/16/2007  T:  12/17/2007  Job:  [id number]   cc:   Nelda Severe, MD

## 2011-05-08 NOTE — Discharge Summary (Signed)
NAME:  Lance [name], BARRIERE NO.:  [id number]   MEDICAL RECORD NO.:  [id number]          PATIENT TYPE:  INP   LOCATION:  3707                         FACILITY:  [facility]   PHYSICIAN:  Nelda Severe, MD      DATE OF BIRTH:  [birth year]   DATE OF ADMISSION:  12/16/2007  DATE OF DISCHARGE:  12/31/2007                               DISCHARGE SUMMARY   He was admitted to the hospital on December 16, 2007 by Dr. Nelda Severe for posterior lumbar fusion.  Preoperative diagnosis of L3-4  spondylosis/stenosis, adjacent segment disease, status post L4 to S1  laminectomy fusion.   Postoperatively, the patient was taken to the postoperative care unit  and admitted to 3100.  He was kept intubated secondary to respiratory  failure, secondary hypoxemia, loss of blood, resulting in shock.  Patient was transfused 2 units packed red blood cells, given volume  support.  Subsequently, critical care team was consulted for further  evaluation and management of the course.  The patient remained  intubation for respiratory failure, as a consequence of both pulmonary  edema and pneumonia.  A number of cultures were taken, blood cultures,  urine cultures, respiratory cultures, all proven negative.  Patient was  treated subsequently with Zosyn and vancomycin until cultures were  finalized, then they were discontinued on December 25, 2007 and December 24, 2007.   He had a 2D echo that revealed an ejection fraction of 65-75% and mild  left ventricular hypertrophy.  He had medical consult by Dr. Elliot Cousin.  This was done on December 27, 2007 in preparation for him to be  moved from the unit to 3700.  He was in 3707, bed 2.   He was treated with nasal cannula oxygen, followed for acute blood loss  anemia during the hospitalization.  Neuropathy with chronic gabapentin,  hypertension, diabetes type 2, chronic anxiety, tobacco abuse, history  of previous surgery, obesity, benign prostatic  hypertrophy.   His primary care physician, Dr. Reather Littler, was also notified of the  patient's status.  Recommendations for the primary were to restart  BuSpar, decreasing the Valium necessity at this point.  Antihypertensive  medications include labetalol.  Toprol was discontinued.  Decrease  Reglan.  Start Lantus at bedtime.  He was then weaned off of his oxygen  as of December 30, 2007.   Patient was ambulating.  Physical therapy was working on mobility and  ambulation.  The patient has been satting at 92-97% on room air with no  difficulties in terms of shortness of breath.   The incision was clean, dry, healed well.  Sutures were removed at the  bedside today on December 31, 2007.   Patient is being discharged on his regular preoperative medications.  We  are also sending him home on Norco 10/325 at a count of 120, 1-2 every 4  hours p.r.n. pain control, Robaxin 500 mg 1-2 every 6 hours, Chantix  starter pack to help him quit smoking.   He is going to follow up in our office in 3-4 weeks.  He is going to  follow up with Dr. Lucianne Muss.   DISCHARGE DIAGNOSES:  Status post L3-4 laminectomy fusions secondary to  spondylosis/stenosis, status post L4-S1 previous fusions.   STATUS:  Stable.   DIET:  Low carb diet.      Lianne Cure, P.A.      Nelda Severe, MD  Electronically Signed    MC/MEDQ  D:  12/31/2007  T:  12/31/2007  Job:  [id number]   cc:   Reather Littler, M.D.

## 2011-05-08 NOTE — Op Note (Signed)
NAME:  Lance [name], Lance [name] NO.:  [id number]   MEDICAL RECORD NO.:  [id number]          PATIENT TYPE:  INP   LOCATION:  5033                         FACILITY:  [facility]   PHYSICIAN:  Feliberto Gottron. Turner Daniels, M.D.   DATE OF BIRTH:  [birth year]   DATE OF PROCEDURE:  05/02/2009  DATE OF DISCHARGE:                               OPERATIVE REPORT   PREOPERATIVE DIAGNOSIS:  End-stage arthritis, left hip.   POSTOPERATIVE DIAGNOSIS:  End-stage arthritis, left hip.   PROCEDURE:  Left total hip arthroplasty using a DePuy 52-mm ASR cup, a  16 x 11 x 36 S-ROM stem, 16B small cone, NK+0 47-mm Ultimate head.   SURGEON:  Feliberto Gottron. Turner Daniels, MD   FIRST ASSISTANT:  Shirl Harris, PA-C   ANESTHESIA:  General endotracheal.   ESTIMATED BLOOD LOSS:  400 mL.   FLUID REPLACEMENT:  1800 mL of crystalloid.   DRAINS PLACED:  Foley catheter.   URINE OUTPUT:  About 500 mL.   INDICATIONS FOR PROCEDURE:  A 55 year old man on full disability  secondary to multiple back surgeries and right total hip that was placed  by one of my partners.  He now has end-stage arthritis of his left hip  with bone-on-bone changes and desires elective left total hip  arthroplasty.  Risks and benefits of surgery have been discussed and  questions answered.  He is well aware what he is getting himself into as  he had his right total hip done a couple of years ago.  His  comorbidities include obesity, diabetes, high blood pressure, and when  Nelda Severe did his back surgery about 2 years ago, he ended up with  ARDS on a ventilator for about 3 weeks but that was a 7-hour case.  In  any event, he is prepared for a surgical intervention and desires total  hip arthroplasty to decrease pain and increase function.  Risks and  benefits of surgery again have been discussed.   DESCRIPTION OF PROCEDURE:  The patient identified by armband and  received preoperative IV antibiotics, 2 grams of Ancef in the holding  area at [facility].  He was then taken to operating room 5 where  the appropriate anesthetic monitors were reattached and general  endotracheal anesthesia induced with the patient in supine position.  A  Foley catheter was then inserted and he was then rolled into the right  lateral decubitus position and held there with a Stulberg Mark II pelvic  clamp and an axillary roll placed.  At this time, the left lower  extremity was prepped and draped in the usual sterile fashion from the  ankle to the hemipelvis and the skin along the lateral hip and thigh was  infiltrated with 20 mL of 0.5% Marcaine and epinephrine solution after  first performing a standard time-out procedure.  We then began the  operation itself by making a 15-cm incision centered over the greater  trochanter allowing a posterolateral approach to the hip joint.  We cut  through the skin and subcutaneous tissue down to the level of the IT  band which was incised in  line with skin incision.  This exposed the  greater trochanter and we then placed retractors between the gluteus  minimus and the superior hip joint capsule and the quadratus femoris and  the inferior hip joint capsule isolating the short external rotators and  piriformis which were then tagged with a #2 Ethibond suture and cut off  their insertion on the intertrochanteric crest exposing the posterior  aspect of the hip joint capsule.  This was developed into an acetabular  based flap going from posterior-superior off the acetabular rim out of  the neck of the femur and then exiting inferiorly-posteriorly along the  acetabular rim, creating the flap which was likewise tagged with two #2  Ethibond sutures.  About 50% of the origin of the rectus femoris  superiorly was released and this allowed [place] to flex and internally  rotate the hip, dislocating the arthritic femoral head which was then  cut in standard fashion about one fingerbreadth above the lesser  trochanter.  We then  translated the proximal femur anteriorly using a  Homan retractor off their superior anterior column.  A posterior-  inferior wing retractor was placed to the junction of the ischium and  the acetabulum and a spike Cobra retractor in the cotyloid notch.  This  allowed [place] to remove the labrum and sequentially ream up to a 51-mm  basket reamer obtaining good coverage in all quadrants and then reamed  peripherally to 52 mm to make sure there were no sharp edges.  The  acetabulum was then irrigated out with normal saline solution and a 52-  mm ASR shell was hammered into place and 45 degrees of abduction and  about 20 degrees of anteversion.  He had had good fit and fill.  The hip  was then flexed and internally rotated and we began entering the femur  with the box chisel followed by the initiating reamer and followed by  the 8-mm axial reamer.  The patient had a very tight stenotic canal.  We  actually got chattered 9 mm and sequentially reamed up to an 11-mm axial  reamer obtaining a very good chatter at 10, 10.5, and 11 and then went  down to full depth with 11.5.  We then conically reamed up to a 16B  cone, again obtaining good hard bone even with the 16B and milled the  calcar to a 16B small.  A 16B small cone was then hammered and  osteophytes were removed.  A trial stem with a 36 neck and +0 trial ball  were then hammered into place in the same version as the calcar about 20  degrees and the hip was reduced.  Excellent stability was noted at 90 of  flexion and 60 of internal rotation and in extension and external  rotation, the hip could not be dislocated.  At this time, the trial  components were removed and the wound was again irrigated out normal  saline solution.  A 16B small ZTT-1 cone was then hammered into place  followed by the 11.5 reamer through the cone and the 16 x 11 x 36 stem  was then hammered into place and seated nicely and a NK+0 47 Ultimate  ball was then hammered  onto the stem and the hip reduced.  Hip came to  full extension and the knee could be flexed to 90 degrees, the hip could  be flexed to 90.  It was 60 of internal rotation before there was any  instability.  The  wound was once again lavaged out with normal saline  solution.  The capsular flap and short external rotators were repaired  back to the intertrochanteric crest through drill holes and the IT band  was then closed with running #1 Vicryl suture, the subcutaneous tissue  with 0 and 2-0 undyed Vicryl suture, and the skin with skin staples.  A  dressing of Xeroform and Mepilex was then applied.  The patient was  unclamped, rolled supine, awakened, and taken to the recovery room  without difficulty.      Feliberto Gottron. Turner Daniels, M.D.  Electronically Signed     FJR/MEDQ  D:  05/02/2009  T:  05/03/2009  Job:  [id number]

## 2011-05-08 NOTE — Procedures (Signed)
NAME:  Lance [name], ANGUIANO NO.:  [id number]   MEDICAL RECORD NO.:  [id number]          PATIENT TYPE:  OUT   LOCATION:  SLEEP CENTER                 FACILITY[facility]  [facility]   PHYSICIAN:  Clinton D. Maple Hudson, MD, FCCP, FACPDATE OF BIRTH:  1955/12/12   DATE OF STUDY:  02/27/2009                            NOCTURNAL POLYSOMNOGRAM   REFERRING PHYSICIAN:  Ranelle Oyster, M.D.   INDICATION FOR STUDY:  Hypersomnia with sleep apnea.   EPWORTH SLEEPINESS SCORE:  Epworth sleepiness score 3/24.  BMI 40.8.  Weight 223 pounds, height 62 inches.  Neck 19 inches.   MEDICATIONS:  Home medications are charted and reviewed.  He states I  go to sleep late and get up to use the bathroom a lot at night.  Sleep  about 2 hours at a time.   SLEEP ARCHITECTURE:  Total sleep time 310 minutes with sleep efficiency  74.6%.  Stage I was 9.2%, stage II 75.5%, stage III absent, REM 15.3% of  total sleep time.  Sleep latency 31 minutes.  REM latency 212 minutes.  Awake after sleep onset 75 minutes.  Arousal index of 6.  Bedtime  medication:  Hydrocodone for back pain.   RESPIRATORY DATA:  NPSG protocol.  Apnea-hypopnea index (AHI) 8.3 per  hour.  A total of 43 events was counted including 8 obstructive apneas,  6 central apneas, and 29 hypopneas.  Events were not positional.  REM  AHI 34.1 per hour.   OXYGEN DATA:  Loud snoring with oxygen desaturation to a nadir of 88%.  Mean oxygen saturation through the study 95.3% on room air.   CARDIAC DATA:  Normal sinus rhythm.   MOVEMENT-PARASOMNIA:  Occasional limb jerk without sleep disturbance.  Bathroom x1.  Questionable bruxism.   IMPRESSIONS-RECOMMENDATIONS:  1. Mild obstructive sleep apnea/hypopnea syndrome, AHI 8.3 per hour      with nonpositional events, very loud snoring, and oxygen      desaturation to a nadir of 88%.  2. Consider return for CPAP titration or evaluate for alternative      therapy as appropriate.  3. Sleep architecture was not  remarkable for sleep center environment.      There were intermittent wakings, but he was able to regain sleep,      contrary to his description of home sleep pattern.      Clinton D. Maple Hudson, MD, [facility], FACP  Diplomate, Biomedical engineer of Sleep Medicine  Electronically Signed     CDY/MEDQ  D:  03/04/2009 20:36:34  T:  03/05/2009 05:56:37  Job:  [id number]

## 2011-05-08 NOTE — Assessment & Plan Note (Signed)
Lance [name] is back regarding his chronic low back pain.  He had good  results with the right hip injection and is still having some benefit  there.  He felt that the fentanyl patch was helpful, although, he had  some problems with adhesion and caring for the patches.  He is still  using his Neurontin as prescribed in the 4-5 a day range.  He takes  hydrocodone for breakthrough pain.  He still has not got his shoe lift  as we prescribed due to family issues.  Apparently, his sister had a  major stroke.  He has been visiting her and been involved with her care.   The patient rates his pain a 9/10, described as aching.  Pain interferes  with his general activity, relations with others, and enjoyment of life  on a mild-to-moderate level.  Sleep is poor.  Pain is in the back, right  hip, and into the left foot at times.  Pain worsens with walking and  prolonged sitting.  Sometimes improves with rest and the medications  above.   REVIEW OF SYSTEMS:  Notable for numbness, tingling, trouble walking, and  painful urination.  He had this tested apparently.  He reports shortness  of breath, sleep apnea, night sweats, and fever.  Full review is in the  written health and history section.   SOCIAL HISTORY:  The patient is married and I doubt specific change.  He  has just returned from [place], visiting his son.   PHYSICAL EXAMINATION:  VITAL SIGNS:  Blood pressure is 174/99.  The  patient states he has not had his blood pressure medications today.  Pulse is 82, respiratory rate 18, and he is sating 97% on room air.  GENERAL:  The patient is pleasant, bit more alert than I saw him last  time and still walking with varus movement and antalgia on the left  side.  MUSCULOSKELETAL:  Strength is 4/5 proximal and distal with some  inhibition still at the left more than right.  Distally, strength is 3+  to 4/5 left lower extremity.  He has 4+/5 right lower extremity.  Both  greater trochanters were  slightly painful to palpation.  He has pain  along the lower lumbar paraspinal muscles and facets as well.  Straight  leg testing was equivocal.  His lumbar fusion scars are noted, which are  intact.  The patient could bend to approximately 50 degrees, perhaps 60  degrees today at the waist.  He was able to extend to 15 degrees with  pain noted.  He had problems with facet maneuvers today.  The patient  had decreased sensation distally in both legs.  Cognitively, he is  intact.  HEART:  Regular.  CHEST:  Clear.  ABDOMEN:  Soft and nontender.  Weight was generally stable.   ASSESSMENT:  1. Lumbar post-laminectomy syndrome.  2. Right greater trochanter bursitis.  3. 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  4. Non-insulin requiring diabetes.  5. Left lumbar radiculopathy perhaps at the L4-L5 levels.  6. Obesity.  7. Anxiety.  8. Left leg length discrepancy.   PLAN:  1. The patient needs to see the orthotist for the shoe buildup.  He      also might benefit from knee bracing.  2. Hold off on further injections today.  3. Need to acquire injection series from Dr. Phylliss Bob office.  4. We will switch him from fentanyl to Opana  ER 5 mg q.12 h. to see if      he has more consistent pain relief.  Continue with Norco for      breakthrough pain at the 10/325 dose 1 q.6 h. p.r.n.  He has 60 of      these left.  5. Maintain Neurontin for neuropathic pain.  6. Once we get his pain decreased, we will pursue therapy to improve      posture, strengthen back, etc.      Ranelle Oyster, M.D.  Electronically Signed     ZTS/MedQ  D:  12/07/2008 13:11:46  T:  12/08/2008 06:42:54  Job #:  [id number]   cc:   Nelda Severe, MD  Fax: [phone]

## 2011-05-08 NOTE — Assessment & Plan Note (Signed)
HISTORY:  [name] is back regarding his chronic low back and hip  pain.  Left hip replacement per Dr. Turner Daniels in 5/10.  He states that the  legs now settled and he still having problems with leg length  discrepancy.  Left hip is actually appears higher than the right.  His  pain is 10/10.  We switched from Opana over to MS Contin since I last  saw him and this does not seem to be working quite as well for him.  Pain is burning, stabbing, and aching.  He is in the medial of physical  therapy, but really does little outside therapy for exercise.   REVIEW OF SYSTEMS:  Notable for bladder control issues, occasional night  sweats, high sugars, and limb swelling.  Other pertinent positives above  and full reviews in written health and history section of the chart.   SOCIAL HISTORY:  The patient is married, lives with his wife.  Trying to  cut out his smoking still.   PHYSICAL EXAMINATION:  VITAL SIGNS:  Blood pressure is 149/85, pulse 76,  respiratory rate 16, and sating 96% on room air.  GENERAL:  The patient is pleasant, alert, and oriented x3.  Affect  showing bright and appropriate.  EXTREMITIES:  He has tenderness over the lower lumbar spine.  Left leg  now was longer than the right with the discrepancy of approximately half  to three-quarters of an inch.  He walks favoring the leg still.  Did not  have him flex today due to his hip precautions.  Strength generally 4-  5/5 both lower extremities and 5/5 in the upper extremities.  Reflexes  1+ and sensation decreased distally in the lower extremities.  Cognitively, he is intact, he is in good spirits.  HEART:  Regular.  CHEST:  Clear.  ABDOMEN:  Soft and nontender.   ASSESSMENT:  1. Lumbar post laminectomy syndrome.  2. Osteoarthritis of the left hip, status post left hip replacement,      now with left leg length longer than the right.  3. History of lumbar radiculopathy.  4. Obesity.  5. Obstructive sleep apnea.   PLAN:  1.  The patient need to continue working on CPAP mask that are      tolerable.  His sleep is imperative in his overall health and pain      picture.  2. I encouraged exercise outside of his physical therapy.  3. Increase his MS Contin to 60 mg q.12 h. for baseline pain control      with Norco 10 for breakthrough pain.  He was given prescriptions      for #60 and #120 respectively.  4. The patient may need shoe accommodations for his left foot.  He      should be walking regularly without inserts or likely a lift in the      left shoe.  5. Continue physical therapy.  6. I will see him back in about 3 months' time.  He will see nursing      back in 1 month.      Ranelle Oyster, M.D.  Electronically Signed     ZTS/MedQ  D:  07/08/2009 10:03:30  T:  07/08/2009 23:43:50  Job #:  [id number]   cc:   Nelda Severe, MD  Fax: [phone]

## 2011-05-08 NOTE — Consult Note (Signed)
NAME:  Lance, [name] NO.:  [id number]   MEDICAL RECORD NO.:  [id number]          PATIENT TYPE:  INP   LOCATION:  3108                         FACILITY:  [facility]   PHYSICIAN:  Elliot Cousin, M.D.    DATE OF BIRTH:  06-Sep-1956   DATE OF CONSULTATION:  12/27/2007  DATE OF DISCHARGE:                                 CONSULTATION   CONSULTATION REPORT   PRIMARY CARE PHYSICIAN:  Reather Littler, MD.   REFERRING PHYSICIAN:  Kelton Pillar. Alveda Reasons, MD.   REASON FOR CONSULTATION:  Diabetes and hypertension management.   HISTORY OF PRESENT ILLNESS:  The patient is a 55 year old man, who was  admitted by surgeon, Dr. Nelda Severe, on December 16, 2007, for  surgical repair of lumbar back disease including L3 to L4 spondylosis  and stenosis.  The patient underwent the operation on December 16, 2007.  Postoperatively, there was significant hypoxia and blood loss resulting  in shock.  The patient was transfused two units of packed red blood  cells and given volume support.  Subsequently, the Critical Care Team  was consulted for further evaluation and management.  Over the course of  the earlier part of the hospitalization, the patient remained intubated  for respiratory failure as a consequence of both pulmonary edema and  pneumonia.  A number of cultures were ordered including blood cultures,  urine cultures, and respiratory cultures.  All of the cultures have  proven to be negative.  The patient had been intermittently febrile  during the earlier part of the hospitalization with an elevated white  blood cell count.  He had been treated with Zosyn, which was  discontinued on December 25, 2007, and with Vancomycin, which was  discontinued on December 24, 2007.  The patient's two-D echocardiogram  revealed an ejection fraction of 65 to 75% and mild left ventricular  hypertrophy.  Although he had preserved LV function, he was treated  intermittently with intravenous Lasix for pulmonary  edema.  The patient  was subsequently extubated yesterday successfully.  He is currently  alert and oriented, however, he continues to have a frothy sputum and  chest congestion by his history.   The patient has had hypertension and type 2 diabetes mellitus for a  number of years.  He is treated by endocrinologist, Dr. Lucianne Muss.  His  usual diabetes regimen includes Amaryl, Metformin, and NovoLog.  For  treatment of his hypertension, he took Amlodipine, Labetalol, and  possibly Lisinopril/HCTZ.  The patient denies any history of diabetic  foot ulcers, chronic kidney disease, or diabetic retinopathy.   PAST MEDICAL HISTORY:  1. Status post L3 to L4 bilateral laminectomy and removal of hardware      at L4 to S1, etc., on December 16, 2007, by Dr. Nelda Severe.  2. Postoperative shock and hypoxia following the lumbar back surgery.  3. Ventilator-dependent respiratory failure during this      hospitalization, following the lumbar surgery.  The respiratory      failure was attributed to pulmonary edema and pneumonia.  4. Two-D echocardiogram on November 27, 2007, revealed an ejection  fraction of 65 to 75% and mild LVH.  5. Acute blood loss anemia during this hospitalization requiring blood      transfusions.  6. Neuropathy, on chronic Gabapentin.  7. Hypertension.  8. Type 2 diabetes mellitus.  9. Chronic anxiety.  10.Tobacco abuse.  11.Status post right hip arthroplasty in January of 2007, by Dr. Deidre Ala.  12.History of previous lumbar surgery in February of 2006.  13.Obesity.  14.Benign prostatic hypertrophy.   CURRENT MEDICATIONS:  1. Gabapentin 1200 mg q.8h.  2. Sliding scale NovoLog q.4h.  3. Atrovent nebulizer q.6h.  4. Xopenex nebulizer q.6h.  5. Reglan 10 mg IV q.6h.  6. Lopressor 25 mg p.o. q.6h.  7. Protonix 40 mg daily.  8. Senokot-S-S one tablet b.i.d.  9. Ambien 5 mg q.h.s. p.r.n.  10.Tylenol 650 mg q.4h p.r.n.  11.Valium 5 to 10 mg every 6 hours  p.r.n.  12.Norco 10 mg/325 mg one to two tablets every 4 hours p.r.n. pain.  13.Robaxin 500 mg IV q.6h p.r.n.  14.Phenergan 25 mg IV q.6h p.r.n.   HOME MEDICATIONS:  1. Gabapentin 600 mg two tablets t.i.d.  2. Amlodipine 10 mg daily.  3. Amaryl questioned dose daily.  4. Lisinopril/HCTZ 20/25 mg daily (possibly discontinued; the patient      cannot recall).  5. Doxazosin 4 mg daily.  6. BuSpar 15 mg one-and-a-half to two tablets daily.  7. Labetalol one to two tablets daily.  8. Metformin 1000 mg two tablets daily.  9. NovoLog 20 units t.i.d. and p.r.n.   ALLERGIES:  The patient has no known drug allergies.   SOCIAL HISTORY:  The patient is married.  He is currently on disability.  He was formerly employed in Engineer, manufacturing systems.  He has four children in  all.  He has been smoking cigarettes for more than 20 years.  He is down  to a half a pack of cigarettes per day.  He denies alcohol and illicit  drug use.   PHYSICAL EXAMINATION:  VITAL SIGNS:  Temperature 98.3, blood pressure  156/72, pulse 97, respiratory rate 29, oxygen saturation 92% on 3 liters  of nasal cannula oxygen, capillary blood glucose 141.  GENERAL:  The patient is a pleasant, alert, obese, African-American man,  who is currently sitting up in bed in no acute distress.  HEENT:  Head is normocephalic and nontraumatic, pupils are equal, round,  and reactive to light, extraocular movements are intact, conjunctivae  are clear, sclerae are white, oropharynx reveals no teeth.  Mucous  membranes are mildly dry.  No posterior exudates or erythema.  NECK:  Obese, supple, no adenopathy, no thyromegaly, no bruit, no JVD.  LUNGS:  Diffuse rhonchi in the upper lobes and decreased breath sounds  in the lower lobes.  His breathing is currently nonlabored.  HEART:  S1 S2 with a soft systolic murmur.  ABDOMEN:  Obese, positive bowel sounds, soft and nontender, possibly  mildly distended, no tenderness, no hepatosplenomegaly, no  masses  palpated.  GU AND RECTAL:  Deferred.  EXTREMITIES:  Pedal pulses palpable bilaterally.  No pretibial edema and  no pedal edema.  NEUROLOGIC:  The patient is alert and oriented to himself, place, and  family.  He was a little unsure about whether or not it was 2009 or  2008.  Cranial nerves are intact, II-XII.  Strength of the upper  extremities is 5/5 with regards to hand grip bilaterally.  The patient  is able to raise each  leg off of the bed approximately 10 to 20 degrees.  Sensation is grossly intact.   CURRENT LABORATORY DATA:  WBC 16.6, hemoglobin 9.0, hematocrit 26.6,  platelets 503, sodium 140, potassium 3.5, chloride 105, CO2 30, glucose  112, BUN 12, creatinine 1.10, calcium 8.2.   ASSESSMENT:  1. Type 2 diabetes mellitus.  The patient's capillary blood glucose      has been ranging from 140 to 185 today.  He is currently being      treated with sliding scale NovoLog.  Lantus is currently on hold.      In the outpatient setting, he is treated chronically with Amaryl,      Metformin, and NovoLog.  2. Hypertension.  The patient's systolic blood pressure is mildly to      moderately elevated.  He is currently being treated with Metoprolol      25 mg q.6h.  In the outpatient setting, he is treated chronically      with Amlodipine, possibly Lisinopril/HCTZ, and Labetalol.  The      patient has no known history of angioedema on Lisinopril, however,      this is suspect given that he did have a fair amount of edema of      his tongue upon intubation.  Therefore the Lisinopril will not be      restarted.  3. Chronic anxiety.  The patient acknowledges that he takes BuSpar      chronically.  4. Tobacco abuse.  5. Status post extubation,  for ventilator-dependent respiratory      failure due to pulmonary edema and pneumonia.  The patient      continues to have pulmonary infiltrates and quite a bit of      pulmonary rhonchi on exam.  His white blood cell count is still       elevated; however, he has been afebrile for 24 hours.  The Zosyn      and Vancomycin were recently discontinued.  His cultures have been      negative.  6. Acute blood loss anemia.  The patient was transfused during the      hospital course.  His hemoglobin is currently 9.0.   RECOMMENDATIONS:  1. Restart BuSpar and decrease the dose of the as needed Valium.  2. Change the patient's antihypertensive medication to Labetalol in      the morning and discontinue Toprol.  Will consider restarting      Amlodipine if needed.  3. Will decrease the dose of Reglan and eventually wean off as the      Reglan could be a potential cause of an increase in anxiousness.  4. Will add a small dose of Lantus at bedtime tonight.  Will restart      Metformin and Amaryl only if his capillary blood glucose becomes      uncontrolled.  Will check a hemoglobin A1c.  5. Will replete potassium chloride.  6. Will add a nicotine patch.  Will order tobacco cessation      counseling.  7. Will check a.m. labs.      Elliot Cousin, M.D.  Electronically Signed    DF/MEDQ  D:  12/27/2007  T:  12/27/2007  Job:  [id number]   cc:   Reather Littler, M.D.

## 2011-05-08 NOTE — Assessment & Plan Note (Signed)
[name] is back regarding his low back pain.  He is liking physical  therapy, but is sore the next day.  TENS unit appears to have been  helpful and he is asking for home unit order.  He does not notice a lot  of relief with heel pain.  His sleep cycle is still quite out of order.  He uses his Rozerem late at night usually at 2 o'clock and does not fall  sleep until late and wakes up at 11 to 12 o'clock in the morning.  He  states he will take a catnaps during the evening at 8 to 9 o'clock.  His  sleep sometimes is interrupted.  Wife notes essential snoring while he  rest as well.  He uses Norco for breakthrough pain.  He is on Neurontin  1200 mg 3 times a day.  He rates his pain 9/10, 10 overall.  The pain  interferes with general activity, relations with others, enjoyment of  life on a moderate-to-severe level.  Sleep is poor.   REVIEW OF SYSTEMS:  Notable for spasms, numbness, night sweats, high  sugars, painful urination at times, and sleep apnea symptoms.  Full  review is in the written health and history section of the chart.  Other  pertinent positives are above.   SOCIAL HISTORY:  Unchanged.  He lives with his wife and they seem to be  somewhat happily married.   PHYSICAL EXAMINATION:  VITAL SIGNS:  Blood pressure is 153/87, pulse is  83, respiratory rate 18.  He is sating 95% on room air.  EXTREMITIES:  The patient is to have poor range of motion with 60  degrees or so forward flexion noted and 10-15 degrees of extension.  He  is able to rotate around 30 degrees.  He has pain to palpation over the  lower lumbar spine and facet maneuvers caused some pain more than  flexion today I felt.  He had decreased pinprick in both legs.  He  remains obese particularly abdominally.  PSYCH:  Mood was alert and pleasant.  HEART:  Regular.  CHEST:  Clear.  Breathing sometimes sounds labored and obstructed.  ABDOMEN:  Soft, nontender.   ASSESSMENT:  1. Lumbar post laminectomy  syndrome.  2. 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  3. Right greater trochanter bursitis.  4. 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  5. History of non-insulin-requiring diabetes.  6. Left lower extremity radiculopathy.  7. Obesity.  8. Anxiety.  9. Insomnia/questionable sleep apnea.   PLAN:  1. We will send the patient for sleep study.  Currently, he have one      of these done before several years ago, although I think it is      worth looking at again due to his ongoing sleep problems and      reported symptoms.  2. Continue with physical therapy, work on range and endurance as well      as home exercise program.  3. We will increase Opana ER to 20 mg q.12 h. with Norco 10/325 q.6 h.      p.r.n. #60 and 120 respectively.  4. Increase Rozerem to 8 mg 2 at bedtime.  I asked Mr. Laker on      going to bed a bit earlier and avoid catnaps during the day.  5. Order TENS unit for low back as well as a hinged knee brace for the  left knee.  6. We will see him back in about a month's time.  Continue with      Neurontin 1200 mg q.i.d. for now as well.      Ranelle Oyster, M.D.  Electronically Signed     ZTS/MedQ  D:  02/04/2009 13:19:01  T:  02/05/2009 01:49:25  Job #:  [id number]   cc:   Nelda Severe, MD  Fax: [phone]

## 2011-05-11 NOTE — Discharge Summary (Signed)
NAME:  Lance [name], Lance [name] NO.:  [id number]   MEDICAL RECORD NO.:  [id number]          PATIENT TYPE:  INP   LOCATION:  3038                         FACILITY:  [facility]   PHYSICIAN:  Nelda Severe, MD      DATE OF BIRTH:  Feb 20, 1956   DATE OF ADMISSION:  01/30/2005  DATE OF DISCHARGE:  02/04/2005                                 DISCHARGE SUMMARY   HISTORY AND HOSPITAL COURSE:  This man was admitted for management of back  and leg pain secondary to spinal stenosis and spondylosis.  The day of  admission, he was taken to the operating room where revision laminectomy and  fusion from L4 to S1 was carried out.  Postoperatively his course was  remarkable for a tachycardia, high fevers, and atelectasis visualized on  chest x-ray.  He was followed concurrently by the [facility].  His discharge was delayed by one day by a fever in excess of 103 degrees  Fahrenheit.   On the day of discharge, February 04, 2005, he was evaluated by Dr.  Jerl Santos, who felt that he was stable for discharge and he certainly wanted  to go home.  He had been afebrile for over 24 hours at that point.   Accordingly he was discharged home.  He will be followed in the office in  approximately one month's time.   At the time of discharge, he was ambulatory with a walker, and his wound was  dry without drainage, etc.  His pain control was satisfactory with oral  agents.   FINAL DIAGNOSES:  1.  Lumbar spondylosis, status post laminectomy.  2.  Lumbar spinal stenosis.   CONDITION ON DISCHARGE:  Ambulatory, wound stable.      MT/MEDQ  D:  02/05/2005  T:  02/05/2005  Job:  [id number]

## 2011-05-11 NOTE — Consult Note (Signed)
NAME:  Lance [name], Lance [name] NO.:  [id number]   MEDICAL RECORD NO.:  [id number]          PATIENT TYPE:  INP   LOCATION:  3314                         FACILITY:  [facility]   PHYSICIAN:  Sherin Quarry, MD      DATE OF BIRTH:  March 26, 1956   DATE OF CONSULTATION:  01/30/2005  DATE OF DISCHARGE:                                   CONSULTATION   HISTORY OF PRESENT ILLNESS:  Lance [name] is a 55 year old man who is  seen at this time status post a prolonged back operation for treatment of  lumbar spondylosis and herniated disk. We are asked to follow him for  management of his hypertension and diabetes. Of note is that Lance [name]  was previously scheduled for this surgery on December 28, 2004. The patient  was prepped for the OR and after receiving Fentanyl, Versed, Diprivan, his  blood pressure dropped into the range of 70 systolic and the patient  required intravenous fluids and intravenous Neo-Synephrine to bring his  blood pressure up. For this reason the surgery was counseled. The patient  was monitored in the hospital for 24 hours and his blood pressure  stabilized. It seems likely that the problem was that he had taken all of  his blood pressure medicines prior to the procedure and therefore, was  relatively hypotensive. The patient is seen at this time in the immediate  postoperative period and therefore, it is somewhat difficult to get history  from him but apparently, he has done well over the ensuing month and has not  had any problems recently with headache, syncope, breathing difficulty,  chest pain, nausea, vomiting or abdominal pain. Postoperatively at this  time, he is doing well. His blood pressure is stable. He is awake and alert.  He is not experiencing any breathing difficulty or chest pain.   MEDICATIONS:  The patient takes Oxycodone 5 mg p.r.n. for pain, Glucophage 1  gram b.i.d., BuSpar 15 mg b.i.d., Labetalol 300 mg b.i.d., Lisinopril/HCTZ  20/12.5 1  b.i.d., Cardura 4 mg at bedtime daily, Glyset 25 mg a.c. supper,  and Colace for a stool softener on a p.r.n. basis.   ALLERGIES:  No known drug allergies.   PAST SURGICAL HISTORY:  The patient has had 2 previous lumbar laminectomies.   FAMILY HISTORY:  Significant history of diabetes and hypertension.   SOCIAL HISTORY:  The patient lives in [place] with his wife and 2  children. He smokes about 1 pack of cigarettes per day. Denies abuse of  alcohol or drugs.   REVIEW OF SYSTEMS:  Is quite difficult to obtain in the recovery room but is  otherwise negative.   PHYSICAL EXAMINATION:  VITAL SIGNS:  Blood pressure at present is 120/80.  Pulse is 100 and regular.  HEENT:  Examination is within normal limits.  CHEST:  Clear.  CARDIOVASCULAR:  Normal S1 and S2 without murmur, rub, or gallop.  ABDOMEN:  Benign.  EXTREMITIES:  Normal examination.  NEUROLOGIC:  Normal examination.   IMPRESSION:  1.  Diabetes.  2.  Hypertension, said to be well regulated.  3.  History of  2 previous back operations.  4.  Status post recent laminectomy.   PLAN:  Will be happy to follow the patient in the postoperative period. I do  not anticipate that there will be any major problems. Will place him on a  sliding scale insulin regimen. We will re-institute his blood pressure  medications cautiously, watching for signs of hypotension, which I would not  anticipate.   Thank you for the opportunity to see this patient.      SY/MEDQ  D:  01/30/2005  T:  01/30/2005  Job:  [id number]   cc:   Reather Littler, M.D.  [address], [address]  [place]  [place] [address]  Fax: [phone]

## 2011-05-11 NOTE — Op Note (Signed)
NAME:  Lance [name], Lance [name] NO.:  [id number]   MEDICAL RECORD NO.:  [id number]          PATIENT TYPE:  INP   LOCATION:  2550                         FACILITY:  [facility]   PHYSICIAN:  Nelda Severe, MD      DATE OF BIRTH:  1956-01-15   DATE OF PROCEDURE:  12/28/2004  DATE OF DISCHARGE:                                 OPERATIVE REPORT   PREOPERATIVE DIAGNOSIS:  Lumbar spondylosis and stenosis, status post lumbar  laminectomy.   POSTOPERATIVE DIAGNOSES:  1.  Lumbar spondylosis and stenosis, status post lumbar laminectomy.  2.  Intraoperative hypotension.   PROCEDURE:  Bordered lumbar laminectomy and fusion L4 to S1 - lumbar skin  incision to fascia followed by closure.   REFERRING PHYSICIAN:  1.  Charlesetta Shanks, M.D.   SURGEON:  Nelda Severe, M.D.   ASSISTANT:  Charlynn Court, R.N.   ANESTHESIOLOGIST:  Zenon Mayo, M.D.   The patient was placed under general endotracheal anesthesia.  A Foley  catheter was placed in the bladder.  Neurometric monitoring devices were  attached to upper and lower extremities.  Bilateral sequential compression  devices were attached to both lower extremities.   The patient was positioned prone on transverse bolsters on the [place]  table.  The lumbar area was prepped with Betadine and alcohol.  Draping was  in rectangular fashion.  The drapes secured with Ioban.   The previous midline scar was excised in an elliptical fashion.  First skin  was closed with a scalpel, then the subcutaneous tissue was injected with a  mixture of 0.25% Marcaine with epinephrine and 1% lidocaine without  epinephrine.   Incision was carried down to the spinous processes.  The lumbar fascia was  detached on the right side from the spinous processes.  At this time I was  informed by the nurse anesthetist that the patient's systolic pressure was  around 80 and subsequently 70.  At this point, subsequent to taking a  localizing radiograph, I decided to  not attempt to proceed with the  procedure, no matter how successful the anesthesiology team was at restoring  blood pressure.  The lumbar fascia was reopposed using interrupted #1 Vicryl  sutures.  The subcutaneous tissue was closed using interrupted 2-0 Vicryl  inverted sutures.  The skin was closed using interrupted vertical mattress  sutures of 2-0 nylon.  Antibiotic ointment gauze dressing was applied,  secured with an Op-Site.  The patient was then placed in the bed.  He was transferred to the recovery room.  His pressure then came up to  around 115 systolic.  A consult was placed with Hospitalist group, Dr.  Jomarie Longs.  Orders were written for cardiac enzymes and troponins and a stat  EKG.   At the time of dictation, the etiology of the hypotension is not known.      MT/MEDQ  D:  12/28/2004  T:  12/28/2004  Job:  [id number]   cc:   Deidre Ala, M.D.  [address]  [place], [place] [address]  Fax: [phone]

## 2011-05-11 NOTE — H&P (Signed)
NAME:  Lance [name], T[place] NO.:  [id number]   MEDICAL RECORD NO.:  [id number]          PATIENT TYPE:  INP   LOCATION:  3731                         FACILITY:  [facility]   PHYSICIAN:  Theone Stanley, MD   DATE OF BIRTH:  December 27, 1955   DATE OF ADMISSION:  12/28/2004  DATE OF DISCHARGE:                                HISTORY & PHYSICAL   I was contacted by Dr. Alveda Reasons in regard to this patient.  Apparently he was  admitted for an elective lumbar laminectomy; however, after going to OR and  being induced, the patient had hypotension which was resistant to multiple  efforts by anesthesia to bring up his pressures.  At that point in time, the  surgery was cancelled and he was brought out to the recovery room and I was  asked to consult on this patient in regard to his hypotension; however,  because Dr. Alveda Reasons did not perform any surgery, it was felt that the patient  would be best served by being placed on the hospitalist service.  Mr.  [name] is a very pleasant 55 year old African-American gentleman with  diabetes and hypertension presenting for an elective laminectomy secondary  to low back pain and some weakness in his lower extremities.  The patient  has a history of back pain and lumbar spondylosis and stenosis.  He has had  two previous operations.  They have tried to manage him with oral pain  pills; however, his pain got progressively worse and it was decided to  electively take him for a lumbar laminectomy.  Apparently the patient took  all his medications for his hypertension, which was verapamil 240 mg,  labetalol 300 mg, lisinopril/hydrochlorothiazide 20/12.5 mg, and doxazosin 4  mg, as he was instructed.  The patient was brought to the operating room and  was given fentanyl and several other medications for induction for his  surgery.  Within 10-15 minutes his pressure dropped into the 70s.  Anesthesia made several attempts to bring it up, including fluids, about  2800, epinephrine, and Neo was given.  This brought it up slightly.  However, because this patient remained still low, it was decided to stop the  operation and he was brought to the recovery room.  In the recovery room his  pressures, initially in the 100s, came up, at the last time it was 130s.  The patient was alert and oriented and able to answer all questions when I  talked to him.   PAST MEDICAL HISTORY:  Diabetes and hypertension.   MEDICATIONS:  1.  Metformin 1000 mg b.i.d.  2.  Glyset 25 mg before supper.  3.  Verapamil 240 mg one p.o. per day.  4.  Labetalol 300 mg one p.o. b.i.d.  5.  Lisinopril/hydrochlorothiazide dosage of 20/12.5 mg one p.o. b.i.d.  6.  Cardura 4 mg q.h.s.   PAST SURGICAL HISTORY:  Two surgeries on his lumbar in [place], [place].   ALLERGIES:  No known drug allergies.   FAMILY HISTORY:  Significant for diabetes and hypertension.   SOCIAL HISTORY:  The patient lives in [place] with his wife  and has two  children.  Smokes about a pack per day.  Denies any alcohol or illicit drug  use.   REVIEW OF SYSTEMS:  Negative.   PHYSICAL EXAMINATION:  VITAL SIGNS:  Heart rate of 89, blood pressure  138/78, respiratory rate of 18, saturating 96% on 2 L.  HEENT:  Head atraumatic, normocephalic.  Eyes 2 mm, equal, reactive to  light.  Extraocular movements intact.  Ears:  No discharge.  Throat clear,  no erythema, no exudate.  Mucosa moist.  NECK:  Supple, no lymphadenopathy, no JVD.  CARDIAC:  Regular rate and rhythm.  CHEST:  Lungs clear to auscultation bilaterally on anterior exam.  ABDOMEN:  Obese, soft, nontender, nondistended.  EXTREMITIES:  No edema, cyanosis, or clubbing.   LABORATORY DATA:  White count at 9, hemoglobin 11, hematocrit is 32,  platelets at 281.  CK was 336, CK-MB at 3.2, relative index was 1.  A BMET  was done yesterday, which showed a sodium of 138, potassium of 4, chloride  at 103, BUN at 10, creatinine at 1.2.  EKG was also  done.  There were no  changes compared to preoperatively, and a chest x-ray was done, which showed  no acute issues.   ASSESSMENT AND PLAN:  Lance [name] is a 55 year old African-American  gentleman with diabetes and hypertension presenting for elective surgery and  subsequently developed hypotension.   1.  Hypotension.  At this point in time, based on all the information, the      most likely cause for his hypotensive event during his surgery is the      combination of taking his blood pressure medications and being induced      with narcotics and other medications which would drop his pressures.      After medication for his anesthesia was stopped, his pressures came up      nicely.  There is no evidence of a myocardial infarction or other event      to cause his hypotension.  We will admit him overnight for observation      on telemetry to make sure there are no other causes.  If he remains      stable, he will be discharged tomorrow.  2.  Hypertension.  I will restart some of his blood pressure medications      with parameters to hold if he is less than 130.  3.  Diabetes.  Will continue his Glucophage and Glyset, also cover him with      insulin sliding scale.  4.  Prophylaxis.  Put him on Lovenox.  The patient is to eat.      Atta   AEJ/MEDQ  D:  12/28/2004  T:  12/28/2004  Job:  [id number]   cc:   Reather Littler, M.D.  [address], [address]  [place]  [place] [address]  Fax: [phone]

## 2011-05-11 NOTE — Op Note (Signed)
NAME:  Lance [name], Lance [name] NO.:  [id number]   MEDICAL RECORD NO.:  [id number]          PATIENT TYPE:  INP   LOCATION:  3314                         FACILITY:  [facility]   PHYSICIAN:  Nelda Severe, MD      DATE OF BIRTH:  1956/05/17   DATE OF PROCEDURE:  01/30/2005  DATE OF DISCHARGE:                                 OPERATIVE REPORT   SURGEON:  Nelda Severe, M.D.   ASSISTANT:  Lynford Citizen, R.N.   ANESTHESIOLOGIST:  Guadalupe Maple, M.D.   PREOPERATIVE DIAGNOSIS:  Lumbar spondylosis, lumbar disc herniation,  postlaminectomy lumbar spine.   POSTOPERATIVE DIAGNOSIS:  Lumbar spondylosis, lumbar disc herniation,  postlaminectomy lumbar spine.   OPERATIVE PROCEDURE:  L4-5 bilateral laminectomies (revision on left side),  L5-S1 revision laminectomies (revision on left side), decompression of  bilateral L4, L5, and S1 nerve roots (three levels); posterolateral fusion  L4-5, L5-S1, with autologous graft and BMP (Infuse); posterior interbody  fusion at L4-5 with bilateral Brantigan cages and autograft; posterior  interbody fusion with right-sided Brantigan cage and autograft L5-S1;  segmental fixation L4-5, L5-S1 screws and rods (DePuy); harvest of local  bone graft.   INDICATIONS FOR SURGERY:  Chronic severe back and right leg pain.   OPERATIVE NOTE:  The patient was placed under general endotracheal  anesthesia.  A Foley catheter was placed in the bladder.  Sequential  compression devices were placed on both lower extremities.  The patient was  then positioned prone on the Amery table using transverse bolsters padded  with gel pad under the chest.  Upper extremities were carefully positioned  and shoulders padded with foam as well as the upper extremities padded with  foam.  Care was taken to make sure that the shoulders were not hyperabducted  or hyperflexed and that the elbows were not hyperflexed.   The hips and knees were gently flexed.  Knees, shins, and ankles  were  carefully padded with pillows.   The lumbar area was prepped with DuraPrep and draped in rectangular fashion.  The drapes were secured with Ioban.   The previous incision was excised in elliptical fashion, and the  subcutaneous tissue and lumbar fascia and paraspinal muscles injected with a  mixture of 0.5% Marcaine with epinephrine and 1% lidocaine without  epinephrine.   Incision was carried down to the spinous processes.  A cross-table lateral  radiograph was taken with a Kocher on what was perceived to be the L4 level  and which proved to be the L4 level on the x-ray.   The entire dissection and exposure was extremely arduous, the patient's  muscles being very large and there being lack of pliability in the tissues.  Eventually, we were able to expose bilaterally to the base of the transverse  processes of L4, L5, and the ala of the sacrum bilaterally.   At this point, pedicle holes were made bilaterally at L4, L5, and S1.  This  was done in the same fashion at each level by identifying the base of the  superior articular process, removing a piece of it at its junction with the  pars interarticularis, identifying the posterior end of the pedicle,  perforating it with an awl, and drilling a hole through the pedicle into the  vertebral body using a 3.5 mm drill bit.  The bone was exceedingly hard.  Each hole was carefully palpated with a ball-tip probe.  It was measured for  depth and the depths recorded.  Circumferential palpation was carried out to  make sure there were no breaches of the hole.   Next, we placed radiopaque markers in each hole.  A cross-table lateral  radiograph was taken which showed satisfactory position of holes.   Next, we harvested a local bone graft by means of an acetabular used to ream  down the lamina bilaterally as well as the inferior articular process  bilaterally of L5 and L4.  Next, we completed the laminectomies using a  combination of  Leksell and Kerrison rongeurs so that the L5 lamina was  removed completely, and 80% of the L4 lamina proximally was removed.  The  right side was not scarred, but the stenosis was quite tight, especially  laterally.  Complete facetectomies on the right side were performed at L4-5  and L5-S1, and the L4 and L5 nerve roots decompressed.  On the right side at  L5-S1, the S1 nerve root did not appear compressed.   Facetectomies were also performed at L4-5 and L5-S1 on the left side, but  there was a great deal of scarring, and there was compression of the left L4  nerve root in the neural foramen at L4-5, compression of the L5 nerve root  by recurrent disc herniation at L4-5, and by stenosis laterally at L5-S1,  and compression of the left S1 nerve root by what appeared to be scar and  perhaps some disc herniation intermixed with the scar.  Very arduous  dissection which was quite time consuming was undertaken until the L4, L5,  and S1 nerve roots on the left side had been exposed and adequately  decompressed.   Next, the fusion process was begun.  The ala of the sacrum was decorticated  bilaterally, as were the transverse processes at L5 and L4.  On the right  side, we inserted the screws first, prior to placing graft or BMP, because I  was unable to retain the graft and BMP laterally.  The screws were placed, 7  mm diameter screws, at S1 and 6 mm diameter screws at L5 and L4, and  subsequently BMP and bone graft placed posterolaterally at between L4 and  the ala of the sacrum.   We then contoured an appropriate length titanium rod and provisionally fixed  it to the screws.   Graft was then placed on the left side after decortication of the ala of the  sacrum and transverse processes at L4 and L5 and screws placed in similar  fashion on the left side.   At this point, we began the posterior interbody fusion at L5-S1.  This was begun on the right side.  It was never possible for me to  adequately  mobilize the S1 nerve root enough to perform the posterior interbody fusion  on the left at L5-S1.  The disc space was prepared and the provisionally  attached rod used to hold the disc space distracted while it was curetted  and prepared for insertion of a Brantigan cage.   Autologous graft was packed into the disc space and pushed to the left side.  Ultimately, a 9 mm Brantigan cage was impacted into  the L5-S1 disc space and  then the screws on the rod released to allow compression.  At this point, we  removed the provisionally fixed rod and tested all the screws for EMG  activity distally.  In each of the six screws, the threshold stimulation was  never reached but was always tested to 20 milliamperes.   We then reattached the rod on the left side and contoured another rod for  the right side.  I then prepared the L4-5 disc space bilaterally for  insertion of the Brantigan cage, including curettage and insertion of the  seating chisel for the cage.  During all of Brantigan cage insertions, the  dura was carefully retracted with dural retractors, and the exiting nerve  root at the level above carefully protected.   Once again at L4-5, graft was packed anteriorly, and then a Brantigan cage  packed with graft impacted on either side and countersunk.   Next, we placed any extra graft posterolaterally on either side at L4-5, L5-  S1, right and left sides.   At this point, the couplings on the screws were all torqued.  The  laminectomy site was carefully inspected for any graft which might have  gotten into the laminectomy site and was clear from graft.  Some epidural  bleeding on the right side was controlled with Surgicel and bipolar  coagulation.   A deep 1/8-inch Hemovac drain was then placed.  The lumbar fascia was closed  using continuous #1 Vicryl suture.  A 1/8-inch Hemovac drain was placed in  the subcutaneous layer and that layer closed with 2-0 Vicryl in  interrupted  fashion.  The skin was closed using interrupted buried subcuticular 3-0  Vicryl suture.  The skin edges were reinforced with Steri-Strips.  The 1/8-  inch Hemovac drains were secured with 2-0 nylon sutures in basketweave  fashion.  A triple-antibiotic ointment dressing was applied and secured with  Op-Site.   The patient was then transferred to his bed.  He was awakened.  In the  recovery room, he was able to wiggle his toes and had intact sensation to  light touch in both lower extremities.   The blood loss was estimated at 1,200 cc.  The patient received 4 units of  Cell Saver blood and 1 unit of pre-donated autologous blood.  There were no  intraoperative complications.  The patient was stable throughout the  procedure.  The sponge and needle counts were correct.      MT/MEDQ  D:  01/31/2005  T:  01/31/2005  Job:  [id number]

## 2011-05-11 NOTE — Discharge Summary (Signed)
NAME:  Lance, [name] NO.:  [id number]   MEDICAL RECORD NO.:  [id number]          PATIENT TYPE:  INP   LOCATION:                               FACILITY:  [facility]   PHYSICIAN:  Theone Stanley, MD   DATE OF BIRTH:  1956/03/01   DATE OF ADMISSION:  12/28/2004  DATE OF DISCHARGE:  12/29/2004                                 DISCHARGE SUMMARY   ADMISSION DIAGNOSES:  1.  Back pain, elective laminectomy.  2.  Diabetes.  3.  Hypertension.   DISCHARGE DIAGNOSES:  1.  Back pain, aborted laminectomy procedure secondary to hypotension.  2.  Hypotension secondary to combination of blood pressure medications and      anesthetic.  3.  Diabetes.   HOSPITAL COURSE:  Lance [name] is a 55 year old African American gentleman  with long history of back pain and weakness which was getting worse.  It was  decided to undergo lumbar laminectomy by Dr. Alveda Reasons which was an elective  procedure.  The patient went into the OR and after receiving 5 mL of  fentanyl, 2 mg of Versed, 150 mg Diprivan, his blood pressure dropped  significantly.  Despite IV fluids, epinephrine and neo drip, it did not come  up sufficiently.  It went from 70s systolic into the 100s. It was felt that  it was best to abort the procedure.  At that point in time, Dr. Alveda Reasons asked  for consultation from the [facility].  At the time I saw the patient  in the recovery room, his pressures had come up significantly to 120s and  130s.  The patient was evaluated for MI.  His EKG had no changes and his  troponins were negative.  He did have a slight drop in his hematocrit.  However, this most likely due to the fluid he obtained during his surgery.  Chest x-ray was negative.  The patient was admitted for overnight  observation.  His pressures remained greater than 100 systolic through his  stay.  His saturations in the 90s on room air.  He remained afebrile.  His  last blood pressure was 132/78.  He remained on his  medications for his  diabetes with supplemental insulin sliding scale.  Main issue for the  patient was his pain which was controlled with morphine and he will be  discharged on oxycodone.  The patient was discharged in stable condition on  December 29, 2004.   DISCHARGE MEDICATIONS:  1.  Oxycodone 5 mg two to three p.o. q.4-6h. p.r.n.  2.  Metformin 1000 mg one p.o. b.i.d.  3.  BuSpar 15 mg one p.o. b.i.d.  4.  Labetalol 300 mg one p.o. b.i.d.  5.  Lisinopril/hydrochlorothiazide 20/12.5 mg one p.o. b.i.d..  The patient      is to hold taking this until he sees his primary care physician which is      Dr. Lucianne Muss on January 10.  6.  Doxazosin 4 mg one p.o. at night.  Again this medication is to be held      until he sees his primary care physician on January  10.  7.  He is to continue taking his verapamil at 240 mg one daily.  8.  Glyset 25 mg one p.o. a.c. supper.  9.  Colace 100 mg one p.o. b.i.d. for constipation while he is on the      narcotics.   DISCHARGE INSTRUCTIONS:  1.  The patient was instructed to continue a low-salt, diabetic diet.  2.  Wound care will be per Dr. Alveda Reasons.  3.  He is to take his blood pressure on a daily basis.  If it is less than      100 or greater than 180, he is to call Dr. Lucianne Muss, his primary care      physician.  4.  In regards to future surgery, it would probably be best that the patient      would not take all his blood pressure medications prior to surgery,      maybe holding a couple of them or not even taking them.  This would be      up to Dr. Lucianne Muss and Dr. Alveda Reasons.   FOLLOW UP:  The patient is to follow up with Dr. Lucianne Muss on January 02, 2005  with labs of BMP and CBC, with Dr. Alveda Reasons in one or one-and-a-half weeks from  now.      Atta   AEJ/MEDQ  D:  12/29/2004  T:  12/29/2004  Job:  [id number]

## 2011-05-11 NOTE — Consult Note (Signed)
NAME:  DERK, DOUBEK NO.:  [id number]   MEDICAL RECORD NO.:  [id number]          PATIENT TYPE:  INP   LOCATION:  1508                         [facility]   PHYSICIAN:  Hettie Holstein, D.O.    DATE OF BIRTH:  03/18/1956   DATE OF CONSULTATION:  01/20/2006  DATE OF DISCHARGE:                                   CONSULTATION   REFERRED BY:  1.  Charlesetta Shanks, M.D.   REASON FOR CONSULTATION:  Fever.   PRIMARY CARE PHYSICIAN:  Reather Littler, M.D.   PROCEDURE:  Postoperative right total hip replacement, elective, on January 17, 2006.   HISTORY OF PRESENT ILLNESS:  Mr. Bonneau is a 55 year old African-American  male with a history of diabetes and hypertension as well as spinal stenosis  who presented for elective total hip arthroplasty due to end-stage  degenerative arthritis and underwent this procedure of total hip  arthroplasty on January 17, 2006. He has had continued fevers and spiking  temperatures with a T-max of 103.9 at 6 p.m. on January 27 which his  temperature has remained greater than 101 since that period of time. It is  currently 101.4. He has undergone investigations already through orthopedic  surgery including a chest x-ray which is normal and a KUB for which the  results are not available but from my review revealed some nonspecific  gaseous distention and he is noted also to be in renal failure.   His intraoperative course was reviewed revealing fluid shifts of about 4200  in and about 525 plus  600 mL of estimated blood loss. He did receive  perioperative antibiotics with Ancef. He underwent general anesthesia. His  PACU course was reviewed and for the record was stable.   His urinalysis is being ordered at the present. He has otherwise had no  other complaints with the exception of upper respiratory congestion. His  record from last year was reviewed and revealed postoperative course of  fevers as well without significant etiology.   PAST  MEDICAL HISTORY:  Mr. Wholey has a history of diabetes mellitus  treated for the past 4-5 years, hypertension, questionable obstructive sleep  apnea. He is status post cholecystectomy about 5 years ago. He has had a  revision and laminectomy/fusion at L4-S1 level on January 31, 2005 and was  followed by [facility] hospitalist for that course.   CURRENT MEDICATIONS:  His current hospital medications include:  BuSpar,  Cardura, Byetta, Neurontin, Glucophage, Avelox started by orthopedic service  for sinusitis, Robaxin, __________, HCTZ, labetalol, Prinivil 20 b.i.d.,  verapamil 240 daily, Coumadin and a proton pump inhibitor as well as p.r.n.  Robaxin and narcotics.   SOCIAL HISTORY:  He smokes one pack per day and has been doing this for  about 20 years. He denies alcohol, he is married with 4 children. He is  currently on disability. 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   FAMILY HISTORY:  Noncontributory.   ALLERGIES:  He has no known drug allergies.   HOME MEDICATIONS:  As obtained from the med reconciliation list include:  1.  Course completion with amoxicillin  500 mg t.i.d. on January 15, 2006.  2.  Guaifenesin p.r.n. cough.  3.  Centrum.  4.  Benadryl.  5.  Afrin nasal spray.  6.  Prilosec over the counter on a p.r.n. basis.  7.  Metformin 1000 mg p.o. b.i.d.  8.  Amaryl 2 mg q.a.m. and 4 mg q.h.s.  9.  Zestoretic 20/12.5 b.i.d.  10. Verapamil 240 mg daily.  11. Labetalol 300 mg b.i.d.,  12. BuSpar 15 mg b.i.d.  13. Cardura 4 mg q.h.s.  14. Neurontin 900 mg b.i.d.  15. Byetta 5 mg injections b.i.d. 1 hour before food.   REVIEW OF SYSTEMS:  The patient states that he is having bowel movements. He  is unable to further describe whether or not he is having diarrhea or  constipation. He denies any abdominal pain or chest pain. He does complain  of congestion and denies any respiratory difficulties. No edema or swelling  otherwise fevers as described above.   PHYSICAL  EXAMINATION:  VITAL SIGNS:  Reviewed and his blood pressure was  123/62, T-max of 103.9 and a T-current of 101.4. O2 saturation 97% on 2  liters, respirations 23 to 24, heart rate 109.  GENERAL:  The patient appears nasally congested. He is answering questions  appropriately, he is oriented x3, not in acute distress.  HEART:  He has normal S1, S2 which is regular without murmur.  LUNGS:  Clear with the exception of rhonchi and slightly diminished at the  bases.  ABDOMEN:  Slightly distended. He does have positive bowel sounds that is not  tender. There is no rigidity.  EXTREMITIES:  Reveal no evidence of edema, peripheral pulses are symmetrical  and palpable bilaterally.   LABORATORY DATA:  Sodium is 136, potassium 3.7, BUN 33, creatinine 2.1. From  admission, creatinine of 1.1, glucose 141. Hemoglobin 9 from admission,  hemoglobin of 14.3. WBC is 16.7, hemoglobin of 9, platelet count 177. MCV of  89.   ASSESSMENT:  1.  Postoperative fever, etiology unclear, see plan.  2.  Acute renal failure. Will hold nephrotoxic agents.  3.  Postoperative anemia.  4.  Rhinosinusitis/bronchitis.  5.  Tobacco dependence.  6.  Diabetes mellitus poorly controlled.  7.  Postoperative total hip replacement.  8.  Hypertension, stable.   PLAN:  Mr. Nole source of fever is unclear but may be related to  rhinosinusitis and bronchitis. At present will check a urinalysis and a  blood culture, check spot urine studies and follow his renal function to  improvement. Will  hold his antihypertensives, most specifically ACE inhibitors and avoid  nonsteroidal antiinflammatories. Will hold his Glucophage as his creatinine  clearance is decreased. Will continue prophylaxis for DVT as coordinated by  orthopedic surgery. Thank you for this consultation. Will follow his  clinical course.      Hettie Holstein, D.O.  Electronically Signed     ESS/MEDQ  D:  01/20/2006  T:  01/21/2006  Job:  [id number]   cc:    Reather Littler, M.D.  Fax: [phone]   V. Charlesetta Shanks, M.D.  Fax: [phone]

## 2011-05-11 NOTE — Discharge Summary (Signed)
NAME:  Lance [name], Lance [name] NO.:  [id number]   MEDICAL RECORD NO.:  [id number]          PATIENT TYPE:  INP   LOCATION:  1508                         FACILITY:  [facility]   PHYSICIAN:  Deidre Ala, M.D.    DATE OF BIRTH:  01/25/1956   DATE OF ADMISSION:  01/17/2006  DATE OF DISCHARGE:  01/24/2006                                 DISCHARGE SUMMARY   ADMISSION DIAGNOSES:  1.  End-stage osteoarthritis of the right hip.  2.  Diabetes mellitus.  3.  Benign prostatic hypertrophy.  4.  Hypertension.   DISCHARGE DIAGNOSES:  1.  End-stage osteoarthritis of the right hip status post right total hip      arthroplasty.  2.  Diabetes mellitus.  3.  Benign prostatic hypertrophy.  4.  Hypertension.  5.  Postoperative hemorrhagic anemia, stable at the time of discharge.   PROCEDURE:  The patient was taken to the operating room on January 17, 2006  and underwent a right total hip arthroplasty, DePuy.  Surgeon was Dr.  Doristine Section.  Assistant was [employer], P.A.-C.  Surgery was done under  general anesthesia.   CONSULTATIONS:  1.  Physical therapy.  2.  Occupational therapy.  3.  Dr. Hettie Holstein, D.O. with [facility] for medical management.  4.  Physical medicine and rehabilitation with Dr. Riley Kill.   BRIEF HISTORY:  The patient is a 55 year old male with a longstanding  history of right hip pain.  He has failed conservative treatment up to this  point including interarticular hip injections.  Radiographs in the office  revealed end-stage osteoarthritis of the right hip.  Upon these findings and  failure of conservative treatment, Dr. Renae Fickle felt it was best to proceed with  a right total hip arthroplasty.  The patient agreed.  Risks and benefits of  the surgery were discussed with the patient and patient wished to proceed.   LABORATORY DATA:  CBC, on admission, showed a hemoglobin of 14.3, hematocrit  41.0, white blood cell count 11.4, red blood cell count 4.62.  Serial  hemoglobin and hematocrit were followed throughout hospital stay.  Hemoglobin and hematocrit did decline to 7.9 and 22.6 on January 22, 2006.  Both back to 10.7 and 31.1 on January 23, 2006 status post 2 units of packed  red blood cells.  Differential on admission was essentially all within  normal limits.  Coagulation studies on admission showed PT slightly low at  11.5.  PT and INR at the time of discharge were 27.9 and 2.6, respectively  on Coumadin therapy.  Routine chemistry, on admission, showed glucose high  at 177.  Serial chemistries were followed throughout hospital stay.  Sodium  declined to 134 and 126 and was back in the normal range on the following  day.  Glucose ranged from a low of 141 to a high of 245.  Potassium 3.4 on  January 21, 2006 and January 23, 2006 but was stable at the time of  discharge.  BUN was high at 24 and creatinine 1.8 on January 19, 2006,  remained high to 33 and 2.1 on January 20, 2006.  On January 21, 2006  remained high but was falling at 31 and 1.6, respectively and returned to  the normal range on January 22, 2006.  Urinalysis, on admission, was all  within normal limits.  Urinalysis, on January 20, 2006, showed trace amount  of ketones.  The patient blood type was O positive with antibody screen  negative.  Blood cultures x4 were all negative.  Preop chest x-ray showed no  active cardiopulmonary disease.  Follow-up chest x-ray on January 21, 2006  showed question of right pleural effusion with stable cardiomegaly.  Postoperative hip x-ray showed right hip hemiarthroplasty without  complications.  Abdominal x-rays showed probable fecal impaction.  EKG on  admission showed normal sinus rhythm.  Cannot rule out inferior infarct, age  undetermined.   HOSPITAL COURSE:  The patient was admitted to [facility] and taken  to the operating room.  He underwent the above stated procedure without  complications.  The patient tolerated the procedure  well and allowed to  return to recovery room on orthopedic floor.  Continue postoperative care.  The patient throughout his hospital stay was fairly slowly in the beginning  through the first five days with physical therapy.  Continued to run a fever  as high as in the 103 ranges up until postoperative day #3, at which time  [facility] was called for a consult who followed the  patient closely, adjusting medications appropriately until patient was  stable for discharge.  His hemoglobin fell to 7.9 on January 22, 2006 on  postoperative day #5, was subsequently given 2 units of packed red blood  cells.  Rehab consult was ordered due to patient's slow progress of therapy  and was it was felt that he would probably be a good candidate for therapy.  However, after his blood, the patient began progressing well with therapy to  the point where he was able to be discharged home.  PCA was discontinued on  postoperative day #2.  He was weaned over to oral pain medicines and he will  be subsequently discharged home on January 24, 2006.   DISPOSITION:  The patient was discharged home on January 24, 2006.   DISCHARGE MEDICATIONS:  1.  Percocet 5/325 mg, 1-2 p.o. q.4-6h. p.r.n. pain.  2.  Robaxin 500 mg, 1 p.o. q.6-8h. p.r.n. spasm.  3.  Coumadin per pharmacy protocol.  4.  Avelox 400 mg, 1 p.o. q.d. for __________, 2007, to get a total of seven      days.  5.  Vancomycin 1250 mg IV x1 on January 25, 2006, to get a full dose of five      days; then IV will be discontinued at that time.   ACTIVITY:  The patient is weight-bearing as tolerated to the right lower  extremity with total hip precautions.   WOUND CARE:  The patient is to perform daily dressing changes until no  drainage.  He may shower when no drainage.   FOLLOW UP:  The patient was follow up with Dr. Renae Fickle two weeks from the date  of surgery and to call the office for an appointment.  CONDITION ON DISCHARGE:  Stable  and improved.     ______________________________  Clarene Reamer, P.A.-C.    ______________________________  V. Charlesetta Shanks, M.D.    SW/MEDQ  D:  01/24/2006  T:  01/24/2006  Job:  [id number]

## 2011-05-11 NOTE — Op Note (Signed)
NAME:  Lance [name], VOLAND NO.:  [id number]   MEDICAL RECORD NO.:  [id number]          PATIENT TYPE:  INP   LOCATION:  0002                         FACILITY:  [facility]   PHYSICIAN:  Deidre Ala, M.D.    DATE OF BIRTH:  [birth year]   DATE OF PROCEDURE:  01/17/2006  DATE OF DISCHARGE:                                 OPERATIVE REPORT   PREOPERATIVE DIAGNOSIS:  Right hip end-stage osteoarthritis.   POSTOPERATIVE DIAGNOSIS:  Right hip end-stage osteoarthritis.   PROCEDURE:  Right total hip arthroplasty using uncemented DePuy components  with a Pinnacle acetabular cup, metal on metal, and a Summit tapered stem.   SURGEON:  1.  Charlesetta Shanks, M.D.   ASSISTANT:  Clarene Reamer, P.A.-C.   ANESTHESIA:  General endotracheal.   CULTURES:  None.   DRAINS:  None.   ESTIMATED BLOOD LOSS:  600 cc.   __________.   PATHOLOGIC FINDINGS/HISTORY:  Lance [name] has had back pain.  Seen by  Dr. 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  He  has had a two level lumbar laminectomy and fusion.  His hips have  continually bothered him.  They have been injected by Dr. Alveda Reasons.  He showed  bone-on-bone, so it was elected to proceed with total hip arthroplasty.  At  surgery, he has a very, very tight hip with a very deep hole, very muscular  buttocks, so it was a quite difficult procedure to get acetabular position,  but we did get what I felt to be good abduction and also very good  anteversion.  We wanted to use metal-on-metal, and he had a very tight  canal, so we were only able to get a size 2 stem in place.  We did get good  anteversion of the stem; however, in the end, the regular off-set with the  1.5 and the 5 neck length were too unstable to flexion and internal  rotation.  His best ability was with the high off-set stem with the +5.  We  got it with flexion, internal rotation up to about 75 degrees.  We did  lengthen him, probably 1.5 cm, but I felt this  was essential for his  stability.  We did take an intraoperative x-ray to make sure of our  position, like the stem and cup position on the x-ray, except that we did  lengthen him a bit.  We felt the stability was good, so stuck with this and  stuck with the metal-on-metal.  Our sizes were a 52 mm acetabular cup,  Pinnacle, and an Articuleze metal-on-metal femoral head, +5 neck length.  A  high off-set tapered summit stem, 130 mm with a 1214 taper, and we used a  Pinnacle metal insert in the cup, 36 mm inside diameter, 52 mm outside  diameter.  We used a hole eliminator.  There were no other holes in the  acetabular shell.   PROCEDURE:  With adequate anesthesia obtained, using endotracheal technique,  1 gm of Ancef given IV prophylactically and another one at the end of the  case, the patient was  placed in a left lateral decubitus position with the  right side up.  After standard prepping and draping, the incision was made,  [place]-Moore type, in the posterolateral hip, slightly curvilinear.  The  incision was deep and sharp with adequate hemostasis obtained using the  Bovie electrocoagulator.  The gluteal muscles were then released.  I then  replaced retractors around the piriformis.  I took the external rotators off  the posterior trochanter, dissected down to the capsule, released it with  the flap based medial, and cut and tagged the piriformis as well as the  capsule.  The hip was then dislocated.  Then using the template, I cut the  femoral neck about a finger breadth up.  I removed the femoral head, exposed  the acetabulum, removed osteophytes, and labrum, then successfully reamed up  to bleeding bone with a 51 reamer and touched the rim with a 52.  We then  placed the cup in place with a solid fit with abduction anteversion and  impacted it in.  We then exposed the femur, reamed to a 2, broached to a 2  with anteversion.  We trialed all of the trials previously mentioned.  I  then  decided to go to a posterior lip trial for a polyacetabulum, but we  were not able to reduce the hip; therefore, went back to the metal-on-metal,  put the trial back in place. We were ultimately satisfied with a high offset  +5.  We then implanted the metal articulating liner.  We then implanted the  #2 Summit stem.  We then placed on the metal ball, +5, and articulated the  hip and put it through a range of motion with the above-listed findings with  no appreciable instability, internal rotation and flexion up to internal  rotation of about 75 degrees with flexion, __________.  We had placed the  hole eliminator before we placed the articulating surface of the metal  liner.  The wound was then thoroughly irrigated.  We had not much bleeding,  so we did not put a drain deep.  Then closed the fascia with #1 PDS running,  locking, leaving it open over the trochanter because he was a bit tight  there.  We did get the x-ray, confirming position.  I then closed the wound  with running, locking #1 PDS, as above, 0 and 2-0 running stitch, and skin  staples.  A bulky sterile compressive dressing was applied with a knee  immobilizer.  The patient then having tolerated the procedure well, was  awakened and taken to the recovery room in satisfactory condition for  routine postoperative care and analgesia.   It should be noted that I did close the posterior hip capsule back to the  posterior trochanter with the original tag and sutures.  The patient then,  as above, was taken to the recovery room at the end of the procedure for  routine postoperative care and management.           ______________________________  V. Charlesetta Shanks, M.D.     VEP/MEDQ  D:  01/17/2006  T:  01/17/2006  Job:  [id number]   cc:   Nelda Severe, MD  Fax: [phone]

## 2011-06-01 ENCOUNTER — Ambulatory Visit
Admission: RE | Admit: 2011-06-01 | Discharge: 2011-06-01 | Disposition: A | Discharge status: Home / Self Care | Payer: Medicare Other | Source: Ambulatory Visit | Attending: Orthopedic Surgery | Admitting: Orthopedic Surgery

## 2011-06-01 DIAGNOSIS — T84038A Mechanical loosening of other internal prosthetic joint, initial encounter: Secondary | ICD-10-CM

## 2011-07-19 ENCOUNTER — Telehealth: Payer: Self-pay | Admitting: Internal Medicine

## 2011-07-19 NOTE — Telephone Encounter (Signed)
I have this paper in Lance [name]'s stack of look at papers on his cart. Is there any urgency for him to complete this today? If so please advise. Thanks.

## 2011-07-19 NOTE — Telephone Encounter (Signed)
Spoke with [employer]. She wants to confirm that order for CPAP supplies that she faxed was received. Katie, have you seen this? Please advise and if not I will call back and get her to refax. Thanks!

## 2011-07-19 NOTE — Telephone Encounter (Signed)
No urgency, just checking to see if he received forms. I had advised her when I spoke with her that I would only be calling her back if the form had not been received, therefore will close this encounter.

## 2011-07-20 ENCOUNTER — Telehealth: Payer: Self-pay | Admitting: Internal Medicine

## 2011-07-20 NOTE — Telephone Encounter (Signed)
We have the fax but please explain to the patient that [facility] is not a PCP and doesn't supply a patient with diabetic supplies; he should have the papers faxed to his PCP. Thanks. Also, we do have the CPAP supply papers as well; will fax back as soon as [facility] has signed.

## 2011-07-20 NOTE — Telephone Encounter (Signed)
Katie, have you seen this fax?  Thanks!

## 2011-07-20 NOTE — Telephone Encounter (Signed)
Called number provided above - spoke with Lurena Joiner.  She was informed the fax was received but CDY will not be able to sign for the diabetic supplies as he is not the pt's PCP but he will sign for the cpap supplies.  Once this is signed by [facility], we will fax back to them.  Lurena Joiner verbalized understanding of this and will call pt to see who his PCP is to fax the diabetic request to.

## 2011-07-24 ENCOUNTER — Telehealth: Payer: Self-pay | Admitting: Internal Medicine

## 2011-07-24 NOTE — Telephone Encounter (Signed)
LMTCB-we do not see patient for back issues and [facility] needs to send to his PCP.

## 2011-07-25 NOTE — Telephone Encounter (Signed)
lmomtcb  

## 2011-07-26 NOTE — Telephone Encounter (Signed)
lmomtcb  

## 2011-07-27 NOTE — Telephone Encounter (Signed)
LMTCB and will sign per protocol  

## 2011-08-24 ENCOUNTER — Telehealth: Payer: Self-pay | Admitting: Internal Medicine

## 2011-08-24 NOTE — Telephone Encounter (Signed)
Called and spoke with [facility] and informed them [facility] is not pt's PCP and that this order needs to be faxed to pt's PCP.  Nothing further needed.

## 2011-09-12 LAB — CBC
HCT: 24.9 — ABNORMAL LOW
HCT: 26.3 — ABNORMAL LOW
Hemoglobin: 8.6 — ABNORMAL LOW
Hemoglobin: 8.7 — ABNORMAL LOW
Hemoglobin: 8.9 — ABNORMAL LOW
Hemoglobin: 9 — ABNORMAL LOW
MCHC: 33.9
MCHC: 34
MCHC: 34
MCHC: 34.8
MCHC: 35.3
MCV: 90
MCV: 90.4
Platelets: 421 — ABNORMAL HIGH
Platelets: 502 — ABNORMAL HIGH
Platelets: 513 — ABNORMAL HIGH
Platelets: 525 — ABNORMAL HIGH
RBC: 2.79 — ABNORMAL LOW
RBC: 2.93 — ABNORMAL LOW
RBC: 2.94 — ABNORMAL LOW
RBC: 3.05 — ABNORMAL LOW
RDW: 13.7
RDW: 13.7
RDW: 13.8
RDW: 13.9
RDW: 14.6
WBC: 15.4 — ABNORMAL HIGH

## 2011-09-12 LAB — BASIC METABOLIC PANEL
BUN: 10
BUN: 10
CO2: 27
CO2: 27
CO2: 30
CO2: 31
Calcium: 8 — ABNORMAL LOW
Calcium: 8.1 — ABNORMAL LOW
Calcium: 8.2 — ABNORMAL LOW
Calcium: 8.5
Chloride: 105
Creatinine, Ser: 0.94
Creatinine, Ser: 0.97
Creatinine, Ser: 1.1
GFR calc Af Amer: >60
GFR calc Af Amer: >60
GFR calc non Af Amer: >60
GFR calc non Af Amer: >60
Glucose, Bld: 102 — ABNORMAL HIGH
Glucose, Bld: 111 — ABNORMAL HIGH
Glucose, Bld: 116 — ABNORMAL HIGH
Glucose, Bld: 133 — ABNORMAL HIGH
Potassium: 3.1 — ABNORMAL LOW
Potassium: 3.8
Sodium: 136
Sodium: 139
Sodium: 140
Sodium: 146 — ABNORMAL HIGH

## 2011-09-12 LAB — URINE CULTURE: Colony Count: >=100000

## 2011-09-12 LAB — TSH: TSH: 1.916

## 2011-09-12 LAB — URINALYSIS, ROUTINE W REFLEX MICROSCOPIC
Glucose, UA: NEGATIVE
Ketones, ur: NEGATIVE
Ketones, ur: NEGATIVE
Nitrite: NEGATIVE
Nitrite: NEGATIVE
Protein, ur: NEGATIVE
Urobilinogen, UA: 1
pH: 7.5

## 2011-09-12 LAB — COMPREHENSIVE METABOLIC PANEL
ALT: 124 — ABNORMAL HIGH
AST: 105 — ABNORMAL HIGH
Albumin: 2 — ABNORMAL LOW
Calcium: 7.9 — ABNORMAL LOW
GFR calc Af Amer: >60
Sodium: 144
Total Protein: 5 — ABNORMAL LOW

## 2011-09-12 LAB — URINE MICROSCOPIC-ADD ON

## 2011-09-12 LAB — HEMOGLOBIN A1C
Hgb A1c MFr Bld: 7.3 — ABNORMAL HIGH
Mean Plasma Glucose: 183

## 2011-09-28 LAB — CBC
HCT: 24.7 — ABNORMAL LOW
HCT: 26.5 — ABNORMAL LOW
HCT: 30.7 — ABNORMAL LOW
HCT: 31 — ABNORMAL LOW
HCT: 34.3 — ABNORMAL LOW
Hemoglobin: 10.7 — ABNORMAL LOW
Hemoglobin: 11.9 — ABNORMAL LOW
Hemoglobin: 8.6 — ABNORMAL LOW
Hemoglobin: 8.7 — ABNORMAL LOW
MCHC: 33.7
MCHC: 34.5
MCHC: 34.7
MCHC: 34.9
MCHC: 35
MCV: 88.4
MCV: 89
MCV: 89.6
Platelets: 152
Platelets: 155
Platelets: 182
Platelets: 207
Platelets: 294
RBC: 2.78 — ABNORMAL LOW
RBC: 2.79 — ABNORMAL LOW
RBC: 2.84 — ABNORMAL LOW
RBC: 3.45 — ABNORMAL LOW
RBC: 3.84 — ABNORMAL LOW
RBC: 4.83
RDW: 13.5
RDW: 13.7
RDW: 14
RDW: 14.1
RDW: 14.8
WBC: 10.3
WBC: 11.3 — ABNORMAL HIGH
WBC: 13.4 — ABNORMAL HIGH
WBC: 14.8 — ABNORMAL HIGH
WBC: 9

## 2011-09-28 LAB — BASIC METABOLIC PANEL
BUN: 21
BUN: 24 — ABNORMAL HIGH
BUN: 24 — ABNORMAL HIGH
BUN: 26 — ABNORMAL HIGH
CO2: 26
CO2: 29
CO2: 30
CO2: 30
CO2: 31
Calcium: 7.6 — ABNORMAL LOW
Calcium: 7.7 — ABNORMAL LOW
Calcium: 7.7 — ABNORMAL LOW
Calcium: 7.7 — ABNORMAL LOW
Calcium: 8.3 — ABNORMAL LOW
Chloride: 108
Chloride: 117 — ABNORMAL HIGH
Creatinine, Ser: 1.08
Creatinine, Ser: 1.21
Creatinine, Ser: 1.28
Creatinine, Ser: 1.32
Creatinine, Ser: 1.41
GFR calc Af Amer: >60
GFR calc Af Amer: >60
GFR calc Af Amer: >60
GFR calc Af Amer: >60
GFR calc non Af Amer: 57 — ABNORMAL LOW
GFR calc non Af Amer: 58 — ABNORMAL LOW
GFR calc non Af Amer: >60
GFR calc non Af Amer: >60
GFR calc non Af Amer: >60
Glucose, Bld: 110 — ABNORMAL HIGH
Glucose, Bld: 113 — ABNORMAL HIGH
Glucose, Bld: 116 — ABNORMAL HIGH
Glucose, Bld: 211 — ABNORMAL HIGH
Potassium: 3.3 — ABNORMAL LOW
Potassium: 3.5
Potassium: 3.9
Potassium: 4.1
Potassium: 4.1
Sodium: 136
Sodium: 149 — ABNORMAL HIGH
Sodium: 150 — ABNORMAL HIGH

## 2011-09-28 LAB — URINALYSIS, ROUTINE W REFLEX MICROSCOPIC
Bilirubin Urine: NEGATIVE
Ketones, ur: NEGATIVE
Leukocytes, UA: NEGATIVE
Nitrite: NEGATIVE
Nitrite: NEGATIVE
Protein, ur: 100 — AB
Specific Gravity, Urine: 1.02
Specific Gravity, Urine: 1.023
Urobilinogen, UA: 0.2
pH: 5.5
pH: 5.5
pH: 5.5

## 2011-09-28 LAB — BLOOD GAS, ARTERIAL
Acid-Base Excess: 1.1
Acid-Base Excess: 3.5 — ABNORMAL HIGH
Acid-Base Excess: 4.1 — ABNORMAL HIGH
Acid-Base Excess: 4.5 — ABNORMAL HIGH
Acid-Base Excess: 5.1 — ABNORMAL HIGH
Acid-Base Excess: 5.9 — ABNORMAL HIGH
Acid-base deficit: 5.1 — ABNORMAL HIGH
Bicarbonate: 22.9
Bicarbonate: 25.3 — ABNORMAL HIGH
Bicarbonate: 25.5 — ABNORMAL HIGH
Bicarbonate: 26.1 — ABNORMAL HIGH
Bicarbonate: 26.6 — ABNORMAL HIGH
Bicarbonate: 26.9 — ABNORMAL HIGH
Bicarbonate: 28.3 — ABNORMAL HIGH
Bicarbonate: 28.8 — ABNORMAL HIGH
Bicarbonate: 30.2 — ABNORMAL HIGH
Drawn by: 23588
Drawn by: 28403
FIO2: 0.4
FIO2: 0.4
FIO2: 0.7
FIO2: 0.8
FIO2: 0.8
FIO2: 0.8
FIO2: 1
FIO2: 90
MECHVT: 400
MECHVT: 400
MECHVT: 400
MECHVT: 400
MECHVT: 400
MECHVT: 400
O2 Saturation: 91.2
O2 Saturation: 92.4
O2 Saturation: 94.3
O2 Saturation: 94.4
O2 Saturation: 96.3
O2 Saturation: 97.2
PEEP: 10
PEEP: 14
PEEP: 14
PEEP: 16
PEEP: 5
PEEP: 8
PEEP: 8
Patient temperature: 100.6
Patient temperature: 101.9
Patient temperature: 102.1
Patient temperature: 98.6
Patient temperature: 98.6
Patient temperature: 98.6
Patient temperature: 98.6
RATE: 30
RATE: 30
RATE: 35
RATE: 36
TCO2: 25.1
TCO2: 26.9
TCO2: 27.5
TCO2: 28.2
TCO2: 28.5
TCO2: 30.2
TCO2: 30.6
TCO2: 31.3
TCO2: 31.6
pCO2 arterial: 43
pCO2 arterial: 45.3 — ABNORMAL HIGH
pCO2 arterial: 46.7 — ABNORMAL HIGH
pCO2 arterial: 50 — ABNORMAL HIGH
pCO2 arterial: 51.6 — ABNORMAL HIGH
pCO2 arterial: 56.3 — ABNORMAL HIGH
pCO2 arterial: 71.6
pH, Arterial: 7.333 — ABNORMAL LOW
pH, Arterial: 7.343 — ABNORMAL LOW
pH, Arterial: 7.343 — ABNORMAL LOW
pH, Arterial: 7.356
pH, Arterial: 7.365
pH, Arterial: 7.394
pH, Arterial: 7.398
pO2, Arterial: 166 — ABNORMAL HIGH
pO2, Arterial: 58.1 — ABNORMAL LOW
pO2, Arterial: 65.3 — ABNORMAL LOW
pO2, Arterial: 77.6 — ABNORMAL LOW
pO2, Arterial: 79.3 — ABNORMAL LOW
pO2, Arterial: 84
pO2, Arterial: 98.4

## 2011-09-28 LAB — CULTURE, RESPIRATORY W GRAM STAIN

## 2011-09-28 LAB — I-STAT EC8
BUN: 18
Glucose, Bld: 217 — ABNORMAL HIGH
Potassium: 5.9 — ABNORMAL HIGH
TCO2: 25
pH, Arterial: 7.299 — ABNORMAL LOW

## 2011-09-28 LAB — COMPREHENSIVE METABOLIC PANEL
AST: 37
Albumin: 2.1 — ABNORMAL LOW
Alkaline Phosphatase: 36 — ABNORMAL LOW
BUN: 16
CO2: 31
Calcium: 10.3
Calcium: 8 — ABNORMAL LOW
Creatinine, Ser: 1.11
GFR calc Af Amer: >60
GFR calc non Af Amer: >60
Glucose, Bld: 201 — ABNORMAL HIGH
Potassium: 5.3 — ABNORMAL HIGH
Total Protein: 3.7 — ABNORMAL LOW
Total Protein: 7.2

## 2011-09-28 LAB — CROSSMATCH

## 2011-09-28 LAB — APTT
aPTT: 30
aPTT: 32

## 2011-09-28 LAB — CULTURE, BAL-QUANTITATIVE W GRAM STAIN: Culture: NO GROWTH

## 2011-09-28 LAB — IRON AND TIBC
Iron: 16 — ABNORMAL LOW
Saturation Ratios: 7 — ABNORMAL LOW
TIBC: 228
UIBC: 212

## 2011-09-28 LAB — URINE MICROSCOPIC-ADD ON

## 2011-09-28 LAB — POCT I-STAT EG7
Hemoglobin: 9.9 — ABNORMAL LOW
Operator id: 138551
Potassium: 5.6 — ABNORMAL HIGH
Sodium: 136
TCO2: 27
pH, Ven: 7.301 — ABNORMAL HIGH

## 2011-09-28 LAB — CULTURE, BLOOD (ROUTINE X 2): Culture: NO GROWTH

## 2011-09-28 LAB — TYPE AND SCREEN

## 2011-09-28 LAB — PROTIME-INR
INR: 0.8
INR: 1.1
INR: 1.1
Prothrombin Time: 11.6
Prothrombin Time: 14.8

## 2011-09-28 LAB — I-STAT 8, (EC8 V) (CONVERTED LAB)
BUN: 17
Bicarbonate: 24.5 — ABNORMAL HIGH
Chloride: 103
HCT: 30 — ABNORMAL LOW
Hemoglobin: 10.2 — ABNORMAL LOW
Operator id: 138551
Sodium: 136

## 2011-09-28 LAB — DIFFERENTIAL
Eosinophils Relative: 1
Lymphocytes Relative: 36
Lymphs Abs: 3.5
Monocytes Relative: 8

## 2011-09-28 LAB — MAGNESIUM: Magnesium: 1.8

## 2011-09-28 LAB — HEMOGLOBIN AND HEMATOCRIT, BLOOD
HCT: 32.5 — ABNORMAL LOW
HCT: 32.8 — ABNORMAL LOW
Hemoglobin: 11.1 — ABNORMAL LOW

## 2011-09-28 LAB — FERRITIN: Ferritin: 228 (ref 22–322)

## 2011-09-28 LAB — URINE CULTURE: Culture: NO GROWTH

## 2011-09-28 LAB — VITAMIN D 25 HYDROXY (VIT D DEFICIENCY, FRACTURES): Vit D, 25-Hydroxy: 19 — ABNORMAL LOW (ref 30–89)

## 2011-09-28 LAB — PHOSPHORUS: Phosphorus: 3.2

## 2011-09-28 LAB — FOLATE: Folate: >20

## 2011-09-28 LAB — HEMOGLOBIN A1C
Hgb A1c MFr Bld: 8.1 — ABNORMAL HIGH
Mean Plasma Glucose: 211

## 2011-12-17 ENCOUNTER — Ambulatory Visit: Payer: Medicare Other | Admitting: Internal Medicine

## 2012-01-08 ENCOUNTER — Ambulatory Visit: Payer: Medicare Other | Admitting: Internal Medicine

## 2012-04-01 ENCOUNTER — Other Ambulatory Visit: Payer: Self-pay | Admitting: Orthopedic Surgery

## 2012-04-01 DIAGNOSIS — M25552 Pain in left hip: Secondary | ICD-10-CM

## 2012-04-03 ENCOUNTER — Inpatient Hospital Stay: Admission: RE | Admit: 2012-04-03 | Payer: Medicare Other | Source: Ambulatory Visit

## 2012-04-10 ENCOUNTER — Ambulatory Visit
Admission: RE | Admit: 2012-04-10 | Discharge: 2012-04-10 | Disposition: A | Discharge status: Home / Self Care | Payer: Medicare Other | Source: Ambulatory Visit | Attending: Orthopedic Surgery | Admitting: Orthopedic Surgery

## 2012-04-10 DIAGNOSIS — M25552 Pain in left hip: Secondary | ICD-10-CM

## 2012-04-12 ENCOUNTER — Other Ambulatory Visit: Payer: Medicare Other

## 2012-05-10 ENCOUNTER — Encounter (HOSPITAL_COMMUNITY): Payer: Self-pay | Admitting: Emergency Medicine

## 2012-05-10 ENCOUNTER — Emergency Department (HOSPITAL_COMMUNITY): Payer: Medicare Other

## 2012-05-10 ENCOUNTER — Inpatient Hospital Stay (HOSPITAL_COMMUNITY)
Admission: EM | Admit: 2012-05-10 | Discharge: 2012-05-13 | DRG: 194 | Disposition: A | Discharge status: Home / Self Care | Payer: Medicare Other | Attending: Internal Medicine | Admitting: Internal Medicine

## 2012-05-10 DIAGNOSIS — Z23 Encounter for immunization: Secondary | ICD-10-CM

## 2012-05-10 DIAGNOSIS — N289 Disorder of kidney and ureter, unspecified: Secondary | ICD-10-CM | POA: Diagnosis present

## 2012-05-10 DIAGNOSIS — J189 Pneumonia, unspecified organism: Principal | ICD-10-CM | POA: Diagnosis present

## 2012-05-10 DIAGNOSIS — Q2111 Secundum atrial septal defect: Secondary | ICD-10-CM

## 2012-05-10 DIAGNOSIS — R0689 Other abnormalities of breathing: Secondary | ICD-10-CM

## 2012-05-10 DIAGNOSIS — I059 Rheumatic mitral valve disease, unspecified: Secondary | ICD-10-CM | POA: Diagnosis present

## 2012-05-10 DIAGNOSIS — E1149 Type 2 diabetes mellitus with other diabetic neurological complication: Secondary | ICD-10-CM | POA: Diagnosis present

## 2012-05-10 DIAGNOSIS — G4730 Sleep apnea, unspecified: Secondary | ICD-10-CM

## 2012-05-10 DIAGNOSIS — G894 Chronic pain syndrome: Secondary | ICD-10-CM | POA: Diagnosis present

## 2012-05-10 DIAGNOSIS — I10 Essential (primary) hypertension: Secondary | ICD-10-CM

## 2012-05-10 DIAGNOSIS — D696 Thrombocytopenia, unspecified: Secondary | ICD-10-CM

## 2012-05-10 DIAGNOSIS — R0902 Hypoxemia: Secondary | ICD-10-CM

## 2012-05-10 DIAGNOSIS — E118 Type 2 diabetes mellitus with unspecified complications: Secondary | ICD-10-CM

## 2012-05-10 DIAGNOSIS — E1142 Type 2 diabetes mellitus with diabetic polyneuropathy: Secondary | ICD-10-CM | POA: Diagnosis present

## 2012-05-10 DIAGNOSIS — E1165 Type 2 diabetes mellitus with hyperglycemia: Secondary | ICD-10-CM

## 2012-05-10 DIAGNOSIS — G4710 Hypersomnia, unspecified: Secondary | ICD-10-CM

## 2012-05-10 DIAGNOSIS — Z794 Long term (current) use of insulin: Secondary | ICD-10-CM

## 2012-05-10 DIAGNOSIS — Z79899 Other long term (current) drug therapy: Secondary | ICD-10-CM

## 2012-05-10 DIAGNOSIS — R748 Abnormal levels of other serum enzymes: Secondary | ICD-10-CM | POA: Diagnosis present

## 2012-05-10 DIAGNOSIS — G4733 Obstructive sleep apnea (adult) (pediatric): Secondary | ICD-10-CM | POA: Diagnosis present

## 2012-05-10 DIAGNOSIS — E785 Hyperlipidemia, unspecified: Secondary | ICD-10-CM | POA: Diagnosis present

## 2012-05-10 DIAGNOSIS — Q211 Atrial septal defect: Secondary | ICD-10-CM

## 2012-05-10 HISTORY — DX: Chronic obstructive pulmonary disease, unspecified: J44.9

## 2012-05-10 HISTORY — DX: Shortness of breath: R06.02

## 2012-05-10 HISTORY — DX: Adverse effect of unspecified anesthetic, initial encounter: T41.45XA

## 2012-05-10 HISTORY — DX: Gastro-esophageal reflux disease without esophagitis: K21.9

## 2012-05-10 HISTORY — DX: Other complications of anesthesia, initial encounter: T88.59XA

## 2012-05-10 HISTORY — DX: Hyperlipidemia, unspecified: E78.5

## 2012-05-10 HISTORY — DX: Essential (primary) hypertension: I10

## 2012-05-10 HISTORY — DX: Sleep apnea, unspecified: G47.30

## 2012-05-10 HISTORY — DX: Anxiety disorder, unspecified: F41.9

## 2012-05-10 LAB — CBC
HCT: 48.7 % (ref 39.0–52.0)
HCT: 46.3 % (ref 39.0–52.0)
Hemoglobin: 17.3 g/dL — ABNORMAL HIGH (ref 13.0–17.0)
Hemoglobin: 16.7 g/dL (ref 13.0–17.0)
MCV: 87.4 fL (ref 78.0–100.0)
RBC: 5.57 MIL/uL (ref 4.22–5.81)
RBC: 5.29 MIL/uL (ref 4.22–5.81)
WBC: 10.1 10*3/uL (ref 4.0–10.5)
WBC: 9.6 10*3/uL (ref 4.0–10.5)

## 2012-05-10 LAB — CREATININE, SERUM
Creatinine, Ser: 1.13 mg/dL (ref 0.50–1.35)
GFR calc Af Amer: 82 mL/min — ABNORMAL LOW (ref >90)
GFR calc non Af Amer: 71 mL/min — ABNORMAL LOW (ref >90)

## 2012-05-10 LAB — COMPREHENSIVE METABOLIC PANEL
AST: 87 U/L — ABNORMAL HIGH (ref 0–37)
BUN: 14 mg/dL (ref 6–23)
CO2: 27 mEq/L (ref 19–32)
Chloride: 95 mEq/L — ABNORMAL LOW (ref 96–112)
Creatinine, Ser: 1.14 mg/dL (ref 0.50–1.35)
GFR calc non Af Amer: 70 mL/min — ABNORMAL LOW (ref >90)
Total Bilirubin: 0.3 mg/dL (ref 0.3–1.2)

## 2012-05-10 LAB — GLUCOSE, CAPILLARY
Glucose-Capillary: 299 mg/dL — ABNORMAL HIGH (ref 70–99)
Glucose-Capillary: 346 mg/dL — ABNORMAL HIGH (ref 70–99)

## 2012-05-10 LAB — POCT I-STAT 3, ART BLOOD GAS (G3+)
O2 Saturation: 91 %
pCO2 arterial: 44.6 mmHg (ref 35.0–45.0)
pH, Arterial: 7.432 (ref 7.350–7.450)

## 2012-05-10 LAB — STREP PNEUMONIAE URINARY ANTIGEN: Strep Pneumo Urinary Antigen: NEGATIVE

## 2012-05-10 LAB — EXPECTORATED SPUTUM ASSESSMENT W GRAM STAIN, RFLX TO RESP C

## 2012-05-10 LAB — HEMOGLOBIN A1C
Hgb A1c MFr Bld: 12.3 % — ABNORMAL HIGH (ref <5.7)
Mean Plasma Glucose: 306 mg/dL — ABNORMAL HIGH (ref <117)

## 2012-05-10 MED ORDER — AZITHROMYCIN 500 MG IV SOLR
500.0000 mg | Freq: Once | INTRAVENOUS | Status: AC
  Administered 2012-05-10: 500 mg via INTRAVENOUS
  Filled 2012-05-10: qty 500

## 2012-05-10 MED ORDER — BUPRENORPHINE HCL-NALOXONE HCL 8-2 MG SL SUBL: 1.0000 | SUBLINGUAL_TABLET | Freq: Three times a day (TID) | SUBLINGUAL | Status: DC

## 2012-05-10 MED ORDER — DEXTROSE 5 % IV SOLN
1.0000 g | Freq: Once | INTRAVENOUS | Status: AC
  Administered 2012-05-10: 1 g via INTRAVENOUS
  Filled 2012-05-10: qty 10

## 2012-05-10 MED ORDER — SODIUM CHLORIDE 0.9 % IV SOLN
INTRAVENOUS | Status: DC
  Administered 2012-05-10: 18:00:00 via INTRAVENOUS

## 2012-05-10 MED ORDER — ATORVASTATIN CALCIUM 10 MG PO TABS
10.0000 mg | ORAL_TABLET | Freq: Every day | ORAL | Status: DC
  Administered 2012-05-10 – 2012-05-12 (×3): 10 mg via ORAL
  Filled 2012-05-10 (×4): qty 1

## 2012-05-10 MED ORDER — GUAIFENESIN 100 MG/5ML PO SYRP
200.0000 mg | ORAL_SOLUTION | ORAL | Status: DC | PRN
  Administered 2012-05-10: 200 mg via ORAL
  Filled 2012-05-10: qty 118

## 2012-05-10 MED ORDER — INSULIN NPH (HUMAN) (ISOPHANE) 100 UNIT/ML ~~LOC~~ SUSP: 20.0000 [IU] | Freq: Two times a day (BID) | SUBCUTANEOUS | Status: DC

## 2012-05-10 MED ORDER — INSULIN ASPART 100 UNIT/ML ~~LOC~~ SOLN
0.0000 [IU] | Freq: Every day | SUBCUTANEOUS | Status: DC
  Administered 2012-05-10: 4 [IU] via SUBCUTANEOUS
  Administered 2012-05-11: 2 [IU] via SUBCUTANEOUS

## 2012-05-10 MED ORDER — INSULIN GLARGINE 100 UNIT/ML ~~LOC~~ SOLN: 35.0000 [IU] | Freq: Every day | SUBCUTANEOUS | Status: DC

## 2012-05-10 MED ORDER — ONDANSETRON HCL 4 MG/2ML IJ SOLN: 4.0000 mg | Freq: Three times a day (TID) | INTRAMUSCULAR | Status: AC | PRN

## 2012-05-10 MED ORDER — DEXTROSE 5 % IV SOLN
1.0000 g | INTRAVENOUS | Status: DC
  Administered 2012-05-11 – 2012-05-12 (×2): 1 g via INTRAVENOUS
  Filled 2012-05-10 (×3): qty 10

## 2012-05-10 MED ORDER — BUSPIRONE HCL 15 MG PO TABS
15.0000 mg | ORAL_TABLET | Freq: Two times a day (BID) | ORAL | Status: DC
  Administered 2012-05-10 – 2012-05-12 (×5): 15 mg via ORAL
  Filled 2012-05-10 (×7): qty 1

## 2012-05-10 MED ORDER — CLONIDINE HCL 0.2 MG PO TABS
0.2000 mg | ORAL_TABLET | Freq: Three times a day (TID) | ORAL | Status: DC
  Administered 2012-05-10 – 2012-05-13 (×8): 0.2 mg via ORAL
  Filled 2012-05-10 (×11): qty 1

## 2012-05-10 MED ORDER — INSULIN ASPART 100 UNIT/ML ~~LOC~~ SOLN
0.0000 [IU] | Freq: Three times a day (TID) | SUBCUTANEOUS | Status: DC
  Administered 2012-05-10 – 2012-05-11 (×2): 8 [IU] via SUBCUTANEOUS
  Administered 2012-05-11: 5 [IU] via SUBCUTANEOUS
  Administered 2012-05-11: 8 [IU] via SUBCUTANEOUS
  Administered 2012-05-12: 2 [IU] via SUBCUTANEOUS
  Administered 2012-05-12: 5 [IU] via SUBCUTANEOUS

## 2012-05-10 MED ORDER — LABETALOL HCL 5 MG/ML IV SOLN
10.0000 mg | Freq: Three times a day (TID) | INTRAVENOUS | Status: DC | PRN
  Administered 2012-05-10: 10 mg via INTRAVENOUS
  Filled 2012-05-10: qty 4

## 2012-05-10 MED ORDER — LABETALOL HCL 300 MG PO TABS
300.0000 mg | ORAL_TABLET | Freq: Two times a day (BID) | ORAL | Status: DC
  Administered 2012-05-10 – 2012-05-12 (×5): 300 mg via ORAL
  Filled 2012-05-10 (×7): qty 1

## 2012-05-10 MED ORDER — DEXTROSE 5 % IV SOLN
500.0000 mg | INTRAVENOUS | Status: DC
  Administered 2012-05-11 – 2012-05-12 (×2): 500 mg via INTRAVENOUS
  Filled 2012-05-10 (×3): qty 500

## 2012-05-10 MED ORDER — ENOXAPARIN SODIUM 40 MG/0.4ML ~~LOC~~ SOLN
40.0000 mg | SUBCUTANEOUS | Status: DC
  Administered 2012-05-10 – 2012-05-12 (×3): 40 mg via SUBCUTANEOUS
  Filled 2012-05-10 (×4): qty 0.4

## 2012-05-10 MED ORDER — ALBUTEROL SULFATE (5 MG/ML) 0.5% IN NEBU
5.0000 mg | INHALATION_SOLUTION | Freq: Once | RESPIRATORY_TRACT | Status: AC
  Administered 2012-05-10: 5 mg via RESPIRATORY_TRACT
  Filled 2012-05-10 (×2): qty 0.5

## 2012-05-10 MED ORDER — MORPHINE SULFATE 2 MG/ML IJ SOLN
2.0000 mg | INTRAMUSCULAR | Status: DC | PRN
  Administered 2012-05-10 – 2012-05-11 (×2): 2 mg via INTRAVENOUS
  Filled 2012-05-10 (×2): qty 1

## 2012-05-10 MED ORDER — INSULIN GLARGINE 100 UNIT/ML ~~LOC~~ SOLN
30.0000 [IU] | Freq: Two times a day (BID) | SUBCUTANEOUS | Status: DC
  Administered 2012-05-10 – 2012-05-12 (×4): 30 [IU] via SUBCUTANEOUS

## 2012-05-10 NOTE — H&P (Signed)
PCP:   Reather Littler, MD, MD   Chief Complaint:  Shortness of breath since yesterday  HPI: 56 year old male with h/o HTN, DM, OSA ON  CPAP at home, came in to ED for worsening sob since yesterday, associated with productive cough, chills, subjective fevers. Denies  Any chest pain, syncope, dizziness, nausea or vomiting. On arrival to ED, his mental status was altered, and ABG was done showed normal Ph with hypoxemia. He was put on oxygen and his oxygen saturations improved.   Review of Systems:  The patient denies anorexia, weight loss,, vision loss, decreased hearing, hoarseness, chest pain, syncope,  peripheral edema, balance deficits, abdominal pain, melena, hematochezia, severe indigestion/heartburn, hematuria, incontinence, genital sores, muscle weakness, suspicious skin lesions, transient blindness, difficulty walking, depression, unusual weight change, abnormal bleeding, enlarged lymph nodes, angioedema, and breast masses.  Past Medical History: Past Medical History  Diagnosis Date  . Hypertension   . Diabetes mellitus    History reviewed. No pertinent past surgical history.  Medications: Prior to Admission medications   Medication Sig Start Date End Date Taking? Authorizing Provider  atorvastatin (LIPITOR) 10 MG tablet Take 10 mg by mouth daily.   Yes Historical Provider, MD  buprenorphine-naloxone (SUBOXONE) 8-2 MG SUBL Place 1 tablet under the tongue every 8 (eight) hours.   Yes Historical Provider, MD  busPIRone (BUSPAR) 15 MG tablet Take 15 mg by mouth 2 (two) times daily.   Yes Historical Provider, MD  cloNIDine (CATAPRES) 0.2 MG tablet Take 0.2 mg by mouth every 8 (eight) hours.   Yes Historical Provider, MD  insulin glargine (LANTUS SOLOSTAR) 100 UNIT/ML injection Inject 35 Units into the skin at bedtime. Takes 20 units in am and 50 units in pm   Yes Historical Provider, MD  insulin lispro (HUMALOG) 100 UNIT/ML injection Inject 4-12 Units into the skin 3 (three) times daily  before meals. Sliding scale per MD   Yes Historical Provider, MD  insulin NPH (HUMULIN N,NOVOLIN N) 100 UNIT/ML injection Inject 20-50 Units into the skin 2 (two) times daily before a meal. Takes 20 units in am and 50 units in pm   Yes Historical Provider, MD  labetalol (NORMODYNE) 300 MG tablet Take 300 mg by mouth 2 (two) times daily.   Yes Historical Provider, MD  meloxicam (MOBIC) 15 MG tablet Take 15 mg by mouth daily.   Yes Historical Provider, MD  metFORMIN (GLUCOPHAGE) 1000 MG tablet Take 1,000 mg by mouth 2 (two) times daily with a meal.   Yes Historical Provider, MD  zolpidem (AMBIEN) 10 MG tablet Take 10 mg by mouth at bedtime as needed. For sleep   Yes Historical Provider, MD    Allergies:  Not on File  Social History:  reports that he has been smoking Cigarettes.  He has smoked for the past 10 years. He does not have any smokeless tobacco history on file. He reports that he does not drink alcohol or use illicit drugs.   Family History: History reviewed. No pertinent family history.  Physical Exam: Filed Vitals:   05/10/12 1131 05/10/12 1135 05/10/12 1348 05/10/12 1623  BP:   175/91 195/96  Pulse:   88 89  Temp:      TempSrc:      Resp:   21 20  SpO2: 94% 87% 96% 96%   Constitutional: Vital signs reviewed.  Patient is a well-developed and well-nourished ,in no acute distress and cooperative with exam. drowsy but  oriented x3.  Head: Normocephalic and atraumatic Mouth: no  erythema or exudates, dry MM Eyes: PERRL, EOMI, conjunctivae normal, No scleral icterus.  Neck: Supple, Trachea midline normal ROM, No JVD, mass, thyromegaly, or carotid bruit present.  Cardiovascular: RRR, S1 normal, S2 normal, no MRG, pulses symmetric and intact bilaterally Pulmonary/Chest: scattered rhonchi, decreased air entry at bases.  Abdominal: Soft. Non-tender, non-distended, bowel sounds are normal, no masses, organomegaly, or guarding present.  Musculoskeletal: No joint deformities,  erythema, or stiffness, ROM full and no non tender, trace pedal edema Hematology: no cervical, inginal, or axillary adenopathy.  Neurological: A&O x3, Strenght is normal and symmetric bilaterally, cranial nerve II-XII are grossly intact, no focal motor deficit, sensory intact to light touch bilaterally.  Skin: Warm, dry and intact. No rash, cyanosis, or clubbing.  Psychiatric: Normal mood and affect.    Labs on Admission:   [facility] 05/10/12 1219  NA 135  K 4.0  CL 95*  CO2 27  GLUCOSE 288*  BUN 14  CREATININE 1.14  CALCIUM 9.7  MG --  PHOS --    Basename 05/10/12 1219  AST 87*  ALT 88*  ALKPHOS 136*  BILITOT 0.3  PROT 7.6  ALBUMIN 3.6   No results found for this basename: LIPASE:2,AMYLASE:2 in the last 72 hours  Basename 05/10/12 1219  WBC 10.1  NEUTROABS --  HGB 17.3*  HCT 48.7  MCV 87.4  PLT 243   No results found for this basename: CKTOTAL:3,CKMB:3,CKMBINDEX:3,TROPONINI:3 in the last 72 hours No results found for this basename: TSH,T4TOTAL,FREET3,T3FREE,THYROIDAB in the last 72 hours No results found for this basename: VITAMINB12:2,FOLATE:2,FERRITIN:2,TIBC:2,IRON:2,RETICCTPCT:2 in the last 72 hours  Radiological Exams on Admission: Mr Hip Left Wo Contrast  04/11/2012  *RADIOLOGY REPORT*  Clinical Data: Weakness.  Left hip pain.  Bilateral hip prosthesis. Left hip surgery 2 years ago.  Numbness in the feet.  MRI OF THE LEFT HIP WITHOUT CONTRAST  Technique:  Multiplanar, multisequence MR imaging was performed. No intravenous contrast was administered. Metal artifact reduction sequences were employed.  Comparison: 06/01/2011  Findings: Small left trochanteric fluid collection persists, similar in size to the prior exam and shown on image 20 of series 9.  This measures 2.1 x 0.5 by 2.7 cm.  There is a similar appearance of relative fatty atrophy of the left gluteal musculature compared the right, along with some left sided rectus femoris fatty infiltration.  Continued  left piriformis fatty atrophy noted.  Right greater than left hydroceles noted.  No new fluid collection is observed.  No sciatic nerve impingement is evident.  Proximal hamstring tendons appear symmetric.  There is high T2 and low T1 signal in the left femoral marrow distal to the tip of the implant as shown on image 12 of series 6.  IMPRESSION:  1.  Small persistent trochanteric fluid collection on the left. 2.  Asymmetric marrow edema in the shaft of the left femur just distal to the implant tip is nonspecific but could be an indicator of loosening or infection. 3.  Stable pattern of gluteal and piriformis atrophy on the left.  Original Report Authenticated By: Dellia Cloud, M.D.   Dg Chest Portable 1 View  05/10/2012  *RADIOLOGY REPORT*  Clinical Data: Shortness of breath.  PORTABLE CHEST - 1 VIEW  Comparison: Chest x-ray 12/12/2010.  Findings: Lung volumes are normal.  There is mild cephalization of the pulmonary vasculature without definite evidence of pulmonary edema.  There are ill-defined bibasilar opacities (left greater than right), which may simply reflect some areas of underlying atelectasis, however, air space consolidation (  particularly in the left base where there is a potential for a air bronchograms) is not excluded.  Heart size is borderline enlarged.  Mediastinal contours are within normal limits.  IMPRESSION: 1.  Ill-defined bibasilar opacities (left greater than right) which may reflect areas of atelectasis and/or consolidation. 2.  Mild pulmonary venous congestion without frank pulmonary edema. 3.  Borderline cardiomegaly.  Original Report Authenticated By: Florencia Reasons, M.D.    Assessment/Plan Present on Admission:  1. CAP:   -STARTED the pt on rocephin and zithromax - nasal oxygen as needed - pneumonia work up in progress - repeat  2 view CXR in am,  -CXR  Today shows some pulmonary congestion, will get a probnp and a 2D echocardiogram to evaluate for CHF.      2. Hypertension; - not well controlled - started the patient on his home medications.  -Iv labetalol as needed for sbp> 180/130mmhg.  3. Uncontrolled DM - CBG (last 3)   Basename 05/10/12 1146  GLUCAP 273*    Restarted the patient on his home insulin regimens - SSI - hgba1c - He needs to be on ACE inhibitor   4. OSA  -CPAP at night  5. Hyperlipidemia: continue with statin.  6. Elevated Liver Enzymes:  -US liver ordered.  -Repeat levels in am.   7. DVT prophylaxis; sq Lovenox.  Full code.  Time spent on this patient including examination and decision-making process: 65 minutes.  Juanjose Mojica [phone] 05/10/2012, 4:38 PM

## 2012-05-10 NOTE — ED Provider Notes (Signed)
History     CSN: [id number]  Arrival date & time 05/10/12  1129   First MD Initiated Contact with Patient 05/10/12 1139      Chief Complaint  Patient presents with  . Shortness of Breath    (Consider location/radiation/quality/duration/timing/severity/associated sxs/prior treatment) HPI Comments: Lance [name] is a 56 y.o. male who states that he has had productive cough with mucus and blood-tinged sputum. For several days. He's been using his usual medicines at home. He is on CPAP, but not O2. He does not take inhalers. He denies recent nausea, vomiting, chest pain, dizziness, or weakness. He has had sweating, and chills intermittently. He feels worse when he coughs.  Patient is a 56 y.o. male presenting with shortness of breath. The history is provided by the patient.  Shortness of Breath  Associated symptoms include shortness of breath.    Past Medical History  Diagnosis Date  . Hypertension   . Diabetes mellitus     History reviewed. No pertinent past surgical history.  History reviewed. No pertinent family history.  History  Substance Use Topics  . Smoking status: Current Everyday Smoker -- 10 years    Types: Cigarettes  . Smokeless tobacco: Not on file  . Alcohol Use: No      Review of Systems  Respiratory: Positive for shortness of breath.   All other systems reviewed and are negative.    Allergies  Review of patient's allergies indicates not on file.  Home Medications   Current Outpatient Rx  Name Route Sig Dispense Refill  . ATORVASTATIN CALCIUM 10 MG PO TABS Oral Take 10 mg by mouth daily.    Marland Kitchen BUPRENORPHINE HCL-NALOXONE HCL 8-2 MG SL SUBL Sublingual Place 1 tablet under the tongue every 8 (eight) hours.    . BUSPIRONE HCL 15 MG PO TABS Oral Take 15 mg by mouth 2 (two) times daily.    Marland Kitchen CLONIDINE HCL 0.2 MG PO TABS Oral Take 0.2 mg by mouth every 8 (eight) hours.    . INSULIN GLARGINE 100 UNIT/ML [place] SOLN Subcutaneous Inject 35 Units into the skin  at bedtime. Takes 20 units in am and 50 units in pm    . INSULIN LISPRO (HUMAN) 100 UNIT/ML [place] SOLN Subcutaneous Inject 4-12 Units into the skin 3 (three) times daily before meals. Sliding scale per MD    . INSULIN ISOPHANE HUMAN 100 UNIT/ML [place] SUSP Subcutaneous Inject 20-50 Units into the skin 2 (two) times daily before a meal. Takes 20 units in am and 50 units in pm    . LABETALOL HCL 300 MG PO TABS Oral Take 300 mg by mouth 2 (two) times daily.    . MELOXICAM 15 MG PO TABS Oral Take 15 mg by mouth daily.    Marland Kitchen METFORMIN HCL 1000 MG PO TABS Oral Take 1,000 mg by mouth 2 (two) times daily with a meal.    . ZOLPIDEM TARTRATE 10 MG PO TABS Oral Take 10 mg by mouth at bedtime as needed. For sleep      BP 175/91  Pulse 88  Temp(Src) 99 F (37.2 C) (Oral)  Resp 21  SpO2 96%  Physical Exam  Nursing note and vitals reviewed. Constitutional: He is oriented to person, place, and time. He appears well-developed and well-nourished.  HENT:  Head: Normocephalic and atraumatic.  Right Ear: External ear normal.  Left Ear: External ear normal.  Eyes: Conjunctivae and EOM are normal. Pupils are equal, round, and reactive to light.  Neck: Normal range  of motion and phonation normal. Neck supple.  Cardiovascular: Normal rate, regular rhythm, normal heart sounds and intact distal pulses.   Pulmonary/Chest: He is in respiratory distress (Mild increased work of breathing). He has wheezes. He has no rales. He exhibits no tenderness and no bony tenderness.  Abdominal: Soft. Normal appearance. There is no tenderness.  Musculoskeletal: Normal range of motion.  Neurological: He is alert and oriented to person, place, and time. He has normal strength. No cranial nerve deficit or sensory deficit. He exhibits normal muscle tone. Coordination normal.       Dysarthria  Skin: Skin is warm, dry and intact.  Psychiatric: His behavior is normal. Judgment normal.    ED Course  Procedures (including critical care  time)   Emergency department treatment: IV fluids, IV, Solu-Medrol, nebulizer treatment, IV, Rocephin, IV, Zithromax.   Respiratory status. Assessed with ABG.  Nasal cannula oxygen, given for low oxygen saturation, with improved sat.   14:55- patient is alert and cooperative. Repeat vital signs are reassuring. Admission has been arranged  CRITICAL CARE Performed by: Mancel Bale L   Total critical care time: 40 minutes Critical care time was exclusive of separately billable procedures and treating other patients.  Critical care was necessary to treat or prevent imminent or life-threatening deterioration.  Critical care was time spent personally by me on the following activities: development of treatment plan with patient and/or surrogate as well as nursing, discussions with consultants, evaluation of patient's response to treatment, examination of patient, obtaining history from patient or surrogate, ordering and performing treatments and interventions, ordering and review of laboratory studies, ordering and review of radiographic studies, pulse oximetry and re-evaluation of patient's condition.  Labs Reviewed  GLUCOSE, CAPILLARY - Abnormal; Notable for the following:    Glucose-Capillary 273 (*)    All other components within normal limits  CBC - Abnormal; Notable for the following:    Hemoglobin 17.3 (*)    All other components within normal limits  COMPREHENSIVE METABOLIC PANEL - Abnormal; Notable for the following:    Chloride 95 (*)    Glucose, Bld 288 (*)    AST 87 (*)    ALT 88 (*)    Alkaline Phosphatase 136 (*)    GFR calc non Af Amer 70 (*)    GFR calc Af Amer 81 (*)    All other components within normal limits  POCT I-STAT 3, BLOOD GAS (G3+) - Abnormal; Notable for the following:    pO2, Arterial 59.0 (*)    Bicarbonate 29.8 (*)    Acid-Base Excess 5.0 (*)    All other components within normal limits  BLOOD GAS, ARTERIAL   Dg Chest Portable 1  View  05/10/2012  *RADIOLOGY REPORT*  Clinical Data: Shortness of breath.  PORTABLE CHEST - 1 VIEW  Comparison: Chest x-ray 12/12/2010.  Findings: Lung volumes are normal.  There is mild cephalization of the pulmonary vasculature without definite evidence of pulmonary edema.  There are ill-defined bibasilar opacities (left greater than right), which may simply reflect some areas of underlying atelectasis, however, air space consolidation (particularly in the left base where there is a potential for a air bronchograms) is not excluded.  Heart size is borderline enlarged.  Mediastinal contours are within normal limits.  IMPRESSION: 1.  Ill-defined bibasilar opacities (left greater than right) which may reflect areas of atelectasis and/or consolidation. 2.  Mild pulmonary venous congestion without frank pulmonary edema. 3.  Borderline cardiomegaly.  Original Report Authenticated By: Florencia Reasons, M.D.  1. Community acquired pneumonia   2. Hypercapnia   3. Hypoxia       MDM  Respiratory distress with hypercapnia and signs, and symptoms of pneumonia. Chest x-ray is consistent with pneumonia. No recent hospitalizations, or interventions. Evaluation consistent with community acquired pneumonia. Hypercapnia is mild and can be treated symptomatically. Patient does have sleep apnea and will require CPAP. I do not believe he needs continual positive pressure ventilation at this time. Patient will need to be admitted for stabilization.    Plan: Admit- unassigned for Dr. Darryll Capers, MD 05/10/12 [phone]

## 2012-05-10 NOTE — ED Notes (Signed)
[phone] Liborio Nixon (wife).

## 2012-05-10 NOTE — ED Notes (Signed)
Patient brought in via [facility] with complains of SOB since yesterday. Productive cough with blood tinged sputum; Neb treatment (5 of Albuterol in route.) Patient states his son has also been sick with cough and congestion.

## 2012-05-10 NOTE — Progress Notes (Signed)
Pt admitted to room 4711.  Pt SOB and BP 210/110. Dr. Blake Divine notified. Gave orders to give IV labetalol and clondine.  Will carry out orders and continue to monitor pt.

## 2012-05-11 ENCOUNTER — Inpatient Hospital Stay (HOSPITAL_COMMUNITY): Payer: Medicare Other

## 2012-05-11 ENCOUNTER — Encounter (HOSPITAL_COMMUNITY): Payer: Self-pay | Admitting: Nurse Practitioner

## 2012-05-11 LAB — PHOSPHORUS: Phosphorus: 3.7 mg/dL (ref 2.3–4.6)

## 2012-05-11 LAB — GLUCOSE, CAPILLARY: Glucose-Capillary: 223 mg/dL — ABNORMAL HIGH (ref 70–99)

## 2012-05-11 LAB — DRUGS OF ABUSE SCREEN W/O ALC, ROUTINE URINE
Creatinine,U: 71.1 mg/dL
Marijuana Metabolite: NEGATIVE
Methadone: NEGATIVE
Opiate Screen, Urine: NEGATIVE

## 2012-05-11 LAB — CBC
HCT: 44.7 % (ref 39.0–52.0)
MCHC: 35.1 g/dL (ref 30.0–36.0)
MCV: 88.2 fL (ref 78.0–100.0)
Platelets: 220 10*3/uL (ref 150–400)
RDW: 14.5 % (ref 11.5–15.5)

## 2012-05-11 LAB — COMPREHENSIVE METABOLIC PANEL
AST: 33 U/L (ref 0–37)
Albumin: 3.2 g/dL — ABNORMAL LOW (ref 3.5–5.2)
Alkaline Phosphatase: 117 U/L (ref 39–117)
Chloride: 99 mEq/L (ref 96–112)
Potassium: 3.6 mEq/L (ref 3.5–5.1)
Total Bilirubin: 0.4 mg/dL (ref 0.3–1.2)
Total Protein: 6.8 g/dL (ref 6.0–8.3)

## 2012-05-11 LAB — TSH: TSH: 0.786 u[IU]/mL (ref 0.350–4.500)

## 2012-05-11 LAB — LEGIONELLA ANTIGEN, URINE

## 2012-05-11 LAB — HIV ANTIBODY (ROUTINE TESTING W REFLEX): HIV: NONREACTIVE

## 2012-05-11 LAB — MAGNESIUM: Magnesium: 1.9 mg/dL (ref 1.5–2.5)

## 2012-05-11 MED ORDER — BUPRENORPHINE HCL 8 MG SL SUBL
8.0000 mg | SUBLINGUAL_TABLET | Freq: Three times a day (TID) | SUBLINGUAL | Status: DC
  Administered 2012-05-11: 8 mg via SUBLINGUAL

## 2012-05-11 MED ORDER — PNEUMOCOCCAL VAC POLYVALENT 25 MCG/0.5ML IJ INJ
0.5000 mL | INJECTION | INTRAMUSCULAR | Status: AC
  Administered 2012-05-12: 0.5 mL via INTRAMUSCULAR
  Filled 2012-05-11: qty 0.5

## 2012-05-11 MED ORDER — BUPRENORPHINE HCL 2 MG SL SUBL
8.0000 mg | SUBLINGUAL_TABLET | Freq: Three times a day (TID) | SUBLINGUAL | Status: DC
  Administered 2012-05-11 – 2012-05-12 (×5): 8 mg via SUBLINGUAL
  Filled 2012-05-11 (×6): qty 4

## 2012-05-11 MED ORDER — LISINOPRIL 2.5 MG PO TABS
2.5000 mg | ORAL_TABLET | Freq: Every day | ORAL | Status: DC
  Administered 2012-05-11 – 2012-05-12 (×2): 2.5 mg via ORAL
  Filled 2012-05-11 (×4): qty 1

## 2012-05-11 MED ORDER — BIOTENE DRY MOUTH MT LIQD
15.0000 mL | Freq: Two times a day (BID) | OROMUCOSAL | Status: DC
  Administered 2012-05-11 – 2012-05-13 (×5): 15 mL via OROMUCOSAL

## 2012-05-11 NOTE — Progress Notes (Signed)
Subjective: Back pain is better, breathing is better. Pt refused CPAP at night.   Objective: Weight change:   Intake/Output Summary (Last 24 hours) at 05/11/12 1118 Last data filed at 05/11/12 0536  Gross per 24 hour  Intake    540 ml  Output   2050 ml  Net  -1510 ml    Filed Vitals:   05/11/12 1012  BP: 148/88  Pulse: 70  Temp:   Resp:   alert afebrile comfortable Neck: Supple, Trachea midline normal ROM, No JVD, mass, thyromegaly, or carotid bruit present.  Cardiovascular: RRR, S1 normal, S2 normal, no MRG, pulses symmetric and intact bilaterally  Pulmonary/Chest: scattered rhonchi, decreased air entry at bases.  Abdominal: Soft. Non-tender, non-distended, bowel sounds are normal, no masses, organomegaly, or guarding present. Extremities: trace pedal edema, no cyanosis or clubbing.    Lab Results: Results for orders placed during the hospital encounter of 05/10/12 (from the past 24 hour(s))  CBC     Status: Abnormal   Collection Time   05/10/12 12:19 PM      Component Value Range   WBC 10.1  4.0 - 10.5 (K/uL)   RBC 5.57  4.22 - 5.81 (MIL/uL)   Hemoglobin 17.3 (*) 13.0 - 17.0 (g/dL)   HCT 44.0  10.2 - 72.5 (%)   MCV 87.4  78.0 - 100.0 (fL)   MCH 31.1  26.0 - 34.0 (pg)   MCHC 35.5  30.0 - 36.0 (g/dL)   RDW 36.6  44.0 - 34.7 (%)   Platelets 243  150 - 400 (K/uL)  COMPREHENSIVE METABOLIC PANEL     Status: Abnormal   Collection Time   05/10/12 12:19 PM      Component Value Range   Sodium 135  135 - 145 (mEq/L)   Potassium 4.0  3.5 - 5.1 (mEq/L)   Chloride 95 (*) 96 - 112 (mEq/L)   CO2 27  19 - 32 (mEq/L)   Glucose, Bld 288 (*) 70 - 99 (mg/dL)   BUN 14  6 - 23 (mg/dL)   Creatinine, Ser 4.25  0.50 - 1.35 (mg/dL)   Calcium 9.7  8.4 - 95.6 (mg/dL)   Total Protein 7.6  6.0 - 8.3 (g/dL)   Albumin 3.6  3.5 - 5.2 (g/dL)   AST 87 (*) 0 - 37 (U/L)   ALT 88 (*) 0 - 53 (U/L)   Alkaline Phosphatase 136 (*) 39 - 117 (U/L)   Total Bilirubin 0.3  0.3 - 1.2 (mg/dL)   GFR calc  non Af Amer 70 (*) >90 (mL/min)   GFR calc Af Amer 81 (*) >90 (mL/min)  POCT I-STAT 3, BLOOD GAS (G3+)     Status: Abnormal   Collection Time   05/10/12  2:34 PM      Component Value Range   pH, Arterial 7.432  7.350 - 7.450    pCO2 arterial 44.6  35.0 - 45.0 (mmHg)   pO2, Arterial 59.0 (*) 80.0 - 100.0 (mmHg)   Bicarbonate 29.8 (*) 20.0 - 24.0 (mEq/L)   TCO2 31  0 - 100 (mmol/L)   O2 Saturation 91.0     Acid-Base Excess 5.0 (*) 0.0 - 2.0 (mmol/L)   Collection site RADIAL, ALLEN'S TEST ACCEPTABLE     Drawn by Operator     Sample type ARTERIAL    HEMOGLOBIN A1C     Status: Abnormal   Collection Time   05/10/12  4:37 PM      Component Value Range   Hemoglobin A1C 12.3 (*) <  5.7 (%)   Mean Plasma Glucose 306 (*) <117 (mg/dL)  GLUCOSE, CAPILLARY     Status: Abnormal   Collection Time   05/10/12  5:24 PM      Component Value Range   Glucose-Capillary 299 (*) 70 - 99 (mg/dL)  PRO B NATRIURETIC PEPTIDE     Status: Abnormal   Collection Time   05/10/12  5:55 PM      Component Value Range   Pro B Natriuretic peptide (BNP) 916.4 (*) 0 - 125 (pg/mL)  HIV ANTIBODY (ROUTINE TESTING)     Status: Normal   Collection Time   05/10/12  6:11 PM      Component Value Range   HIV NON REACTIVE  NON REACTIVE   CBC     Status: Abnormal   Collection Time   05/10/12  6:11 PM      Component Value Range   WBC 9.6  4.0 - 10.5 (K/uL)   RBC 5.29  4.22 - 5.81 (MIL/uL)   Hemoglobin 16.7  13.0 - 17.0 (g/dL)   HCT 16.1  09.6 - 04.5 (%)   MCV 87.5  78.0 - 100.0 (fL)   MCH 31.6  26.0 - 34.0 (pg)   MCHC 36.1 (*) 30.0 - 36.0 (g/dL)   RDW 40.9  81.1 - 91.4 (%)   Platelets 234  150 - 400 (K/uL)  CREATININE, SERUM     Status: Abnormal   Collection Time   05/10/12  6:11 PM      Component Value Range   Creatinine, Ser 1.13  0.50 - 1.35 (mg/dL)   GFR calc non Af Amer 71 (*) >90 (mL/min)   GFR calc Af Amer 82 (*) >90 (mL/min)  DRUGS OF ABUSE SCREEN W/O ALC, ROUTINE URINE     Status: Abnormal   Collection Time    05/10/12  7:02 PM      Component Value Range   Marijuana Metabolite NEGATIVE  Negative    Amphetamine Screen, Ur NEGATIVE  Negative    Barbiturate Quant, Ur NEGATIVE  Negative    Methadone NEGATIVE  Negative    Benzodiazepines. NEGATIVE  Negative    Phencyclidine (PCP) NEGATIVE  Negative    Cocaine Metabolites POSITIVE (*) Negative    Opiate Screen, Urine NEGATIVE  Negative    Propoxyphene NEGATIVE  Negative    Creatinine,U 71.1    STREP PNEUMONIAE URINARY ANTIGEN     Status: Normal   Collection Time   05/10/12  7:02 PM      Component Value Range   Strep Pneumo Urinary Antigen NEGATIVE  NEGATIVE   GLUCOSE, CAPILLARY     Status: Abnormal   Collection Time   05/10/12  9:13 PM      Component Value Range   Glucose-Capillary 346 (*) 70 - 99 (mg/dL)  CULTURE, EXPECTORATED SPUTUM-ASSESSMENT     Status: Normal   Collection Time   05/10/12  9:51 PM      Component Value Range   Specimen Description SPUTUM     Special Requests NONE     Sputum evaluation       Value: THIS SPECIMEN IS ACCEPTABLE. RESPIRATORY CULTURE REPORT TO FOLLOW.   Report Status 05/10/2012 FINAL    COMPREHENSIVE METABOLIC PANEL     Status: Abnormal   Collection Time   05/11/12  5:25 AM      Component Value Range   Sodium 139  135 - 145 (mEq/L)   Potassium 3.6  3.5 - 5.1 (mEq/L)   Chloride 99  96 - 112 (mEq/L)   CO2 25  19 - 32 (mEq/L)   Glucose, Bld 303 (*) 70 - 99 (mg/dL)   BUN 13  6 - 23 (mg/dL)   Creatinine, Ser 1.61  0.50 - 1.35 (mg/dL)   Calcium 9.5  8.4 - 09.6 (mg/dL)   Total Protein 6.8  6.0 - 8.3 (g/dL)   Albumin 3.2 (*) 3.5 - 5.2 (g/dL)   AST 33  0 - 37 (U/L)   ALT 57 (*) 0 - 53 (U/L)   Alkaline Phosphatase 117  39 - 117 (U/L)   Total Bilirubin 0.4  0.3 - 1.2 (mg/dL)   GFR calc non Af Amer >90  >90 (mL/min)   GFR calc Af Amer >90  >90 (mL/min)  MAGNESIUM     Status: Normal   Collection Time   05/11/12  5:25 AM      Component Value Range   Magnesium 1.9  1.5 - 2.5 (mg/dL)  PHOSPHORUS     Status:  Normal   Collection Time   05/11/12  5:25 AM      Component Value Range   Phosphorus 3.7  2.3 - 4.6 (mg/dL)  GLUCOSE, CAPILLARY     Status: Abnormal   Collection Time   05/11/12  6:12 AM      Component Value Range   Glucose-Capillary 287 (*) 70 - 99 (mg/dL)   Comment 1 Notify RN       Micro Results: Recent Results (from the past 240 hour(s))  CULTURE, EXPECTORATED SPUTUM-ASSESSMENT     Status: Normal   Collection Time   05/10/12  9:51 PM      Component Value Range Status Comment   Specimen Description SPUTUM   Final    Special Requests NONE   Final    Sputum evaluation     Final    Value: THIS SPECIMEN IS ACCEPTABLE. RESPIRATORY CULTURE REPORT TO FOLLOW.   Report Status 05/10/2012 FINAL   Final     Studies/Results: Dg Chest 2 View  05/11/2012  *RADIOLOGY REPORT*  Clinical Data: Evaluate pneumonia  CHEST - 2 VIEW  Comparison: 05/10/2012  Findings: Mild bibasilar airspace disease shows partial clearing. This may be due to pneumonia or atelectasis.  Small pleural effusions are present without evidence of pulmonary edema.  Cardiac enlargement.  IMPRESSION: Improvement in mild bibasilar airspace disease which may be atelectasis or possibly pneumonia.  Small pleural effusions.  Original Report Authenticated By: Camelia Phenes, M.D.   Dg Chest Portable 1 View  05/10/2012  *RADIOLOGY REPORT*  Clinical Data: Shortness of breath.  PORTABLE CHEST - 1 VIEW  Comparison: Chest x-ray 12/12/2010.  Findings: Lung volumes are normal.  There is mild cephalization of the pulmonary vasculature without definite evidence of pulmonary edema.  There are ill-defined bibasilar opacities (left greater than right), which may simply reflect some areas of underlying atelectasis, however, air space consolidation (particularly in the left base where there is a potential for a air bronchograms) is not excluded.  Heart size is borderline enlarged.  Mediastinal contours are within normal limits.  IMPRESSION: 1.   Ill-defined bibasilar opacities (left greater than right) which may reflect areas of atelectasis and/or consolidation. 2.  Mild pulmonary venous congestion without frank pulmonary edema. 3.  Borderline cardiomegaly.  Original Report Authenticated By: Florencia Reasons, M.D.   Medications: Scheduled Meds:   . albuterol  5 mg Nebulization Once  . antiseptic oral rinse  15 mL Mouth Rinse BID  . atorvastatin  10 mg Oral  Daily  . azithromycin (ZITHROMAX) 500 MG IVPB  500 mg Intravenous Once  . azithromycin  500 mg Intravenous Q24H  . buprenorphine  8 mg Sublingual TID  . busPIRone  15 mg Oral BID  . cefTRIAXone (ROCEPHIN)  IV  1 g Intravenous Once  . cefTRIAXone (ROCEPHIN)  IV  1 g Intravenous Q24H  . cloNIDine  0.2 mg Oral Q8H  . enoxaparin  40 mg Subcutaneous Q24H  . insulin aspart  0-15 Units Subcutaneous TID WC  . insulin aspart  0-5 Units Subcutaneous QHS  . insulin glargine  30 Units Subcutaneous BID  . labetalol  300 mg Oral BID  . pneumococcal 23 valent vaccine  0.5 mL Intramuscular Tomorrow-1000  . DISCONTD: sodium chloride   Intravenous STAT  . DISCONTD: buprenorphine-naloxone  1 tablet Sublingual Q8H  . DISCONTD: buprenorphine-naloxone  1 tablet Sublingual Q8H  . DISCONTD: insulin glargine  35 Units Subcutaneous QHS  . DISCONTD: insulin NPH  20-50 Units Subcutaneous BID AC   Continuous Infusions:  PRN Meds:.guaifenesin, labetalol, morphine injection, ondansetron (ZOFRAN) IV  Assessment/Plan: Patient Active Hospital Problem List: CAP (community acquired pneumonia) (05/10/2012)   On rocephin and zithromax Day 2.  CXR 2 view show partial clearing of the bibasilar opacities.  2d echo pending.  Pro bnp elevated.   Hypertension : better controlled. Resume home medications.   Uncontrolled DM:  CBG (last 3)   Basename 05/11/12 0612 05/10/12 2113 05/10/12 1724  GLUCAP 287* 346* 299*    Will add premeal coverage.  On lantus 30 units bid, will increase to 40 units bid.    Continue with SSI AND ALSO START PATIENT ON LISINOPRIL.  ELEVATED LIVER ENZYMES: IMPROVED LFT'S/  OSA; CPAP  At night.   DVT prophylaxis.   LOS: 1 day   Kavin Weckwerth 05/11/2012, 11:18 AM

## 2012-05-11 NOTE — Evaluation (Signed)
Physical Therapy Evaluation Patient Details Name: Lance [name] MRN: [id number] DOB: 1955/12/31 Today's Date: 05/11/2012 Time: 1191-4782 PT Time Calculation (min): 20 min  PT Assessment / Plan / Recommendation Clinical Impression  pt presents with CAP.  pt seems to be moving near baseline and notes SOB is near normal.  Will set pt up with Mobility Team to help maintain mobility while on acute.  No further PT needs at this time.  Will sign off.      PT Assessment  Patent does not need any further PT services    Follow Up Recommendations  No PT follow up    Barriers to Discharge        lEquipment Recommendations  None recommended by PT    Recommendations for Other Services     Frequency      Precautions / Restrictions Precautions Precautions: None Restrictions Weight Bearing Restrictions: No   Pertinent Vitals/Pain O2 sats 99-100% prior to ambulating, 99% after ambulating on 3L O2.        Mobility  Bed Mobility Bed Mobility: Supine to Sit;Sitting - Scoot to Edge of Bed Supine to Sit: 7: Independent Sitting - Scoot to Edge of Bed: 7: Independent Transfers Transfers: Sit to Stand;Stand to Sit Sit to Stand: 6: Modified independent (Device/Increase time);With upper extremity assist;From bed Stand to Sit: 6: Modified independent (Device/Increase time);With upper extremity assist;To bed Details for Transfer Assistance: Good use of UEs, increased time to complete.  pt notes this is due to chronic back pain.   Ambulation/Gait Ambulation/Gait Assistance: 6: Modified independent (Device/Increase time) Ambulation Distance (Feet): 180 Feet Assistive device: None Ambulation/Gait Assistance Details: pt seems to be near baseline.  pt with leg length discrepancy with L being shorter than R causing L lateral lean.  pt notes this is baseline.   Gait Pattern: Step-through pattern;Right hip hike;Lateral trunk lean to left Stairs: No Wheelchair Mobility Wheelchair Mobility: No      Exercises     PT Diagnosis:    PT Problem List:   PT Treatment Interventions:     PT Goals    Visit Information  Last PT Received On: 05/11/12 Assistance Needed: +1    Subjective Data  Subjective: I've only been here a day.  Why do I need Physical Therapy?   Patient Stated Goal: Home   Prior Functioning  Home Living Lives With: Alone Available Help at Discharge: Family (PRN if needed.  ) Type of Home: House Home Access: Stairs to enter [employer] of Steps: 2 Entrance Stairs-Rails: None Home Layout: Two level;1/2 bath on main level Alternate Level Stairs-Number of Steps: flight Alternate Level Stairs-Rails: Right Home Adaptive Equipment: None Prior Function Level of Independence: Independent Able to Take Stairs?: Yes Driving: Yes Vocation: On disability Communication Communication: No difficulties    Cognition  Overall Cognitive Status: Appears within functional limits for tasks assessed/performed Arousal/Alertness: Awake/alert Orientation Level: Oriented X4 / Intact Behavior During Session: [facility] for tasks performed    Extremity/Trunk Assessment Right Lower Extremity Assessment RLE ROM/Strength/Tone: Within functional levels RLE Sensation: WFL - Light Touch Left Lower Extremity Assessment LLE ROM/Strength/Tone: Within functional levels (Leg length discrepancy with L shorter than R.  ) LLE Sensation: WFL - Light Touch   Balance Balance Balance Assessed: No  End of Session PT - End of Session Equipment Utilized During Treatment: Oxygen Activity Tolerance: Patient tolerated treatment well Patient left: in bed;with call bell/phone within reach (Sitting EOB) Nurse Communication: Mobility status   Sunny Schlein, [place] [phone] 05/11/2012, 9:32 AM

## 2012-05-11 NOTE — Progress Notes (Signed)
  Echocardiogram 2D Echocardiogram has been performed.  Catherin Doorn, Real Cons 05/11/2012, 9:59 AM

## 2012-05-11 NOTE — Progress Notes (Signed)
PT has been refusing cpap. PT states he cannot breathe with the machine. RT will continue to monitor.

## 2012-05-11 NOTE — Progress Notes (Signed)
PT has refused CPAP for the  Night. PT wore mask about 3 seconds and said he could not breathe with the machine. RT placed PT back on 3 lpm nasal cannula and advised nurse that PT refused. RT will continue to monitor.

## 2012-05-12 DIAGNOSIS — E1165 Type 2 diabetes mellitus with hyperglycemia: Secondary | ICD-10-CM

## 2012-05-12 DIAGNOSIS — E118 Type 2 diabetes mellitus with unspecified complications: Secondary | ICD-10-CM

## 2012-05-12 DIAGNOSIS — G4730 Sleep apnea, unspecified: Secondary | ICD-10-CM

## 2012-05-12 DIAGNOSIS — I10 Essential (primary) hypertension: Secondary | ICD-10-CM

## 2012-05-12 DIAGNOSIS — D696 Thrombocytopenia, unspecified: Secondary | ICD-10-CM

## 2012-05-12 DIAGNOSIS — G4710 Hypersomnia, unspecified: Secondary | ICD-10-CM

## 2012-05-12 LAB — GLUCOSE, CAPILLARY
Glucose-Capillary: 218 mg/dL — ABNORMAL HIGH (ref 70–99)
Glucose-Capillary: 144 mg/dL — ABNORMAL HIGH (ref 70–99)

## 2012-05-12 MED ORDER — ZOLPIDEM TARTRATE 5 MG PO TABS
5.0000 mg | ORAL_TABLET | Freq: Once | ORAL | Status: AC
  Administered 2012-05-12: 5 mg via ORAL
  Filled 2012-05-12: qty 1

## 2012-05-12 MED ORDER — GABAPENTIN 600 MG PO TABS
1200.0000 mg | ORAL_TABLET | Freq: Three times a day (TID) | ORAL | Status: DC
  Administered 2012-05-12 – 2012-05-13 (×4): 1200 mg via ORAL
  Filled 2012-05-12 (×6): qty 2

## 2012-05-12 MED ORDER — INSULIN GLARGINE 100 UNIT/ML ~~LOC~~ SOLN
35.0000 [IU] | Freq: Two times a day (BID) | SUBCUTANEOUS | Status: DC
  Administered 2012-05-12: 35 [IU] via SUBCUTANEOUS

## 2012-05-12 NOTE — Progress Notes (Signed)
Left a message on Dr. Roseanne Kaufman cell phone to get results of 2d echo that was completed yesterday to be read and resulted for possible discharge today.  Awaiting call back.  Barrie Lyme 2:31 PM 05/12/2012

## 2012-05-12 NOTE — Progress Notes (Signed)
Subjective: He says he is feeling better. And wants to go home.   Objective: Weight change: -3.806 kg (-8 lb 6.2 oz)  Intake/Output Summary (Last 24 hours) at 05/12/12 2048 Last data filed at 05/12/12 1746  Gross per 24 hour  Intake   1404 ml  Output    625 ml  Net    779 ml    Filed Vitals:   05/12/12 1332  BP: 138/79  Pulse: 68  Temp: 99.2 F (37.3 C)  Resp: 19   On exam He is alert has low grade temp CVS S1S2 heard  Lungs: decreased at bases, no scattered rhonchi Abdomen: soft , obese, NT BS+ Extremities: no pedal edema.   Lab Results: Results for orders placed during the hospital encounter of 05/10/12 (from the past 24 hour(s))  GLUCOSE, CAPILLARY     Status: Abnormal   Collection Time   05/11/12  9:02 PM      Component Value Range   Glucose-Capillary 218 (*) 70 - 99 (mg/dL)   Comment 1 Notify RN    GLUCOSE, CAPILLARY     Status: Abnormal   Collection Time   05/12/12  6:02 AM      Component Value Range   Glucose-Capillary 144 (*) 70 - 99 (mg/dL)   Comment 1 Notify RN    GLUCOSE, CAPILLARY     Status: Abnormal   Collection Time   05/12/12 11:01 AM      Component Value Range   Glucose-Capillary 114 (*) 70 - 99 (mg/dL)   Comment 1 Notify RN     Comment 2 Documented in Chart    GLUCOSE, CAPILLARY     Status: Abnormal   Collection Time   05/12/12  3:53 PM      Component Value Range   Glucose-Capillary 211 (*) 70 - 99 (mg/dL)   Comment 1 Notify RN     Comment 2 Documented in Chart      Micro Results: Recent Results (from the past 240 hour(s))  CULTURE, BLOOD (ROUTINE X 2)     Status: Normal (Preliminary result)   Collection Time   05/10/12  6:00 PM      Component Value Range Status Comment   Specimen Description BLOOD RIGHT ARM   Final    Special Requests BOTTLES DRAWN AEROBIC AND ANAEROBIC 10CC   Final    Culture  Setup Time [id number]   Final    Culture     Final    Value:        BLOOD CULTURE RECEIVED NO GROWTH TO DATE CULTURE WILL BE HELD FOR 5 DAYS  BEFORE ISSUING A FINAL NEGATIVE REPORT   Report Status PENDING   Incomplete   CULTURE, BLOOD (ROUTINE X 2)     Status: Normal (Preliminary result)   Collection Time   05/10/12  6:10 PM      Component Value Range Status Comment   Specimen Description BLOOD RIGHT FOREARM   Final    Special Requests     Final    Value: BOTTLES DRAWN AEROBIC AND ANAEROBIC BLUE 4CC, RED 3CC   Culture  Setup Time [id number]   Final    Culture     Final    Value:        BLOOD CULTURE RECEIVED NO GROWTH TO DATE CULTURE WILL BE HELD FOR 5 DAYS BEFORE ISSUING A FINAL NEGATIVE REPORT   Report Status PENDING   Incomplete   CULTURE, EXPECTORATED SPUTUM-ASSESSMENT     Status: Normal  Collection Time   05/10/12  9:51 PM      Component Value Range Status Comment   Specimen Description SPUTUM   Final    Special Requests NONE   Final    Sputum evaluation     Final    Value: THIS SPECIMEN IS ACCEPTABLE. RESPIRATORY CULTURE REPORT TO FOLLOW.   Report Status 05/10/2012 FINAL   Final   CULTURE, RESPIRATORY     Status: Normal (Preliminary result)   Collection Time   05/10/12  9:51 PM      Component Value Range Status Comment   Specimen Description SPUTUM   Final    Special Requests NONE   Final    Gram Stain PENDING   Incomplete    Culture NORMAL OROPHARYNGEAL FLORA   Final    Report Status PENDING   Incomplete     Studies/Results: Dg Chest 2 View  05/11/2012  *RADIOLOGY REPORT*  Clinical Data: Evaluate pneumonia  CHEST - 2 VIEW  Comparison: 05/10/2012  Findings: Mild bibasilar airspace disease shows partial clearing. This may be due to pneumonia or atelectasis.  Small pleural effusions are present without evidence of pulmonary edema.  Cardiac enlargement.  IMPRESSION: Improvement in mild bibasilar airspace disease which may be atelectasis or possibly pneumonia.  Small pleural effusions.  Original Report Authenticated By: Camelia Phenes, M.D.   Dg Chest Portable 1 View  05/10/2012  *RADIOLOGY REPORT*  Clinical Data:  Shortness of breath.  PORTABLE CHEST - 1 VIEW  Comparison: Chest x-ray 12/12/2010.  Findings: Lung volumes are normal.  There is mild cephalization of the pulmonary vasculature without definite evidence of pulmonary edema.  There are ill-defined bibasilar opacities (left greater than right), which may simply reflect some areas of underlying atelectasis, however, air space consolidation (particularly in the left base where there is a potential for a air bronchograms) is not excluded.  Heart size is borderline enlarged.  Mediastinal contours are within normal limits.  IMPRESSION: 1.  Ill-defined bibasilar opacities (left greater than right) which may reflect areas of atelectasis and/or consolidation. 2.  Mild pulmonary venous congestion without frank pulmonary edema. 3.  Borderline cardiomegaly.  Original Report Authenticated By: Florencia Reasons, M.D.   Medications: Scheduled Meds:   . antiseptic oral rinse  15 mL Mouth Rinse BID  . atorvastatin  10 mg Oral Daily  . azithromycin  500 mg Intravenous Q24H  . buprenorphine  8 mg Sublingual TID  . busPIRone  15 mg Oral BID  . cefTRIAXone (ROCEPHIN)  IV  1 g Intravenous Q24H  . cloNIDine  0.2 mg Oral Q8H  . enoxaparin  40 mg Subcutaneous Q24H  . gabapentin  1,200 mg Oral TID  . insulin aspart  0-15 Units Subcutaneous TID WC  . insulin aspart  0-5 Units Subcutaneous QHS  . insulin glargine  30 Units Subcutaneous BID  . labetalol  300 mg Oral BID  . lisinopril  2.5 mg Oral Daily  . pneumococcal 23 valent vaccine  0.5 mL Intramuscular Tomorrow-1000  . zolpidem  5 mg Oral Once   Continuous Infusions:  PRN Meds:.guaifenesin, labetalol, morphine injection  Assessment/Plan: Patient Active Hospital Problem List: CAP (community acquired pneumonia) (05/10/2012)   Day 2 of antibiotics.   2. Uncontrolled DM:  CBG (last 3)   Basename 05/12/12 1553 05/12/12 1101 05/12/12 0602  GLUCAP 211* 114* 144*   Better controlled. hgba1c is 12.3 Continue  with lantus and SSI  3. Hypertension: better controlled.   4. Elevated probnp: abnormal 2d echo.  Will TEE .   DVT prophylaxis.      LOS: 2 days   Lance [name] 05/12/2012, 8:48 PM

## 2012-05-12 NOTE — Progress Notes (Signed)
Spoke with Dr. Roseanne Kaufman answering service.  Spoke with Kennon Rounds.  Left message with her to give to MD office in AM asking Dr. Algie Coffer to read 2d Echo asap in AM on 05/13/12 for possible DC of patient.  Albesa Seen E 6:00 PM 05/12/2012

## 2012-05-12 NOTE — Progress Notes (Signed)
Utilization review completed.  

## 2012-05-12 NOTE — Progress Notes (Signed)
Called Dr. Roseanne Kaufman cell again for him to read 2D echo.  No answer.    Lance [name] 1610 05/12/2012

## 2012-05-13 ENCOUNTER — Encounter (HOSPITAL_COMMUNITY)
Admission: EM | Disposition: A | Discharge status: Home / Self Care | Payer: Self-pay | Source: Home / Self Care | Attending: Internal Medicine

## 2012-05-13 ENCOUNTER — Encounter (HOSPITAL_COMMUNITY): Payer: Self-pay

## 2012-05-13 DIAGNOSIS — E1165 Type 2 diabetes mellitus with hyperglycemia: Secondary | ICD-10-CM

## 2012-05-13 DIAGNOSIS — E118 Type 2 diabetes mellitus with unspecified complications: Secondary | ICD-10-CM

## 2012-05-13 DIAGNOSIS — D696 Thrombocytopenia, unspecified: Secondary | ICD-10-CM

## 2012-05-13 DIAGNOSIS — G4730 Sleep apnea, unspecified: Secondary | ICD-10-CM

## 2012-05-13 DIAGNOSIS — I10 Essential (primary) hypertension: Secondary | ICD-10-CM

## 2012-05-13 DIAGNOSIS — G4710 Hypersomnia, unspecified: Secondary | ICD-10-CM

## 2012-05-13 HISTORY — PX: TEE WITHOUT CARDIOVERSION: SHX5443

## 2012-05-13 LAB — BASIC METABOLIC PANEL
BUN: 16 mg/dL (ref 6–23)
CO2: 30 mEq/L (ref 19–32)
Calcium: 9.4 mg/dL (ref 8.4–10.5)
GFR calc non Af Amer: 56 mL/min — ABNORMAL LOW (ref >90)
Glucose, Bld: 97 mg/dL (ref 70–99)
Potassium: 3.8 mEq/L (ref 3.5–5.1)
Sodium: 140 mEq/L (ref 135–145)

## 2012-05-13 LAB — CULTURE, RESPIRATORY W GRAM STAIN

## 2012-05-13 LAB — PROTIME-INR: INR: 0.94 (ref 0.00–1.49)

## 2012-05-13 LAB — GLUCOSE, CAPILLARY
Glucose-Capillary: 133 mg/dL — ABNORMAL HIGH (ref 70–99)
Glucose-Capillary: 90 mg/dL (ref 70–99)

## 2012-05-13 SURGERY — ECHOCARDIOGRAM, TRANSESOPHAGEAL
Anesthesia: Moderate Sedation

## 2012-05-13 MED ORDER — BUTAMBEN-TETRACAINE-BENZOCAINE 2-2-14 % EX AERO
INHALATION_SPRAY | CUTANEOUS | Status: DC | PRN
  Administered 2012-05-13: 2 via TOPICAL

## 2012-05-13 MED ORDER — SODIUM CHLORIDE 0.45 % IV SOLN: INTRAVENOUS | Status: DC

## 2012-05-13 MED ORDER — FENTANYL CITRATE 0.05 MG/ML IJ SOLN
INTRAMUSCULAR | Status: AC
  Filled 2012-05-13: qty 4

## 2012-05-13 MED ORDER — FENTANYL CITRATE 0.05 MG/ML IJ SOLN: 250.0000 ug | Freq: Once | INTRAMUSCULAR | Status: DC

## 2012-05-13 MED ORDER — MIDAZOLAM HCL 10 MG/2ML IJ SOLN: 10.0000 mg | Freq: Once | INTRAMUSCULAR | Status: DC

## 2012-05-13 MED ORDER — MIDAZOLAM HCL 10 MG/2ML IJ SOLN
INTRAMUSCULAR | Status: DC | PRN
  Administered 2012-05-13 (×2): 1 mg via INTRAVENOUS

## 2012-05-13 MED ORDER — SODIUM CHLORIDE 0.9 % IJ SOLN: 3.0000 mL | INTRAMUSCULAR | Status: DC | PRN

## 2012-05-13 MED ORDER — SODIUM CHLORIDE 0.9 % IJ SOLN
3.0000 mL | Freq: Two times a day (BID) | INTRAMUSCULAR | Status: DC
  Administered 2012-05-13: 3 mL via INTRAVENOUS

## 2012-05-13 MED ORDER — FENTANYL CITRATE 0.05 MG/ML IJ SOLN
INTRAMUSCULAR | Status: DC | PRN
  Administered 2012-05-13 (×4): 25 ug via INTRAVENOUS

## 2012-05-13 MED ORDER — SODIUM CHLORIDE 0.45 % IV SOLN
INTRAVENOUS | Status: DC
  Administered 2012-05-13: 10 mL via INTRAVENOUS
  Administered 2012-05-13: 09:00:00 via INTRAVENOUS

## 2012-05-13 MED ORDER — LEVOFLOXACIN 750 MG PO TABS: 750.0000 mg | ORAL_TABLET | Freq: Every day | ORAL | Status: AC

## 2012-05-13 MED ORDER — MIDAZOLAM HCL 10 MG/2ML IJ SOLN
INTRAMUSCULAR | Status: AC
  Filled 2012-05-13: qty 4

## 2012-05-13 MED ORDER — BENZOCAINE 20 % MT SOLN: 1.0000 "application " | OROMUCOSAL | Status: DC | PRN

## 2012-05-13 MED ORDER — BENZOCAINE 20 % MT SOLN
1.0000 "application " | OROMUCOSAL | Status: DC | PRN
  Filled 2012-05-13: qty 57

## 2012-05-13 MED ORDER — DIPHENHYDRAMINE HCL 50 MG/ML IJ SOLN
INTRAMUSCULAR | Status: AC
  Filled 2012-05-13: qty 1

## 2012-05-13 MED ORDER — SODIUM CHLORIDE 0.9 % IV SOLN: 250.0000 mL | INTRAVENOUS | Status: DC | PRN

## 2012-05-13 NOTE — Progress Notes (Signed)
1100 back from endoscopy dept s/p tee the patient  Awake , lethargic , coherent . No pain . Safety precaution observed

## 2012-05-13 NOTE — Consult Note (Signed)
Subjective:  Here for TEE to r/o vegetation/clot. No chest pain.  Objective:  Vital Signs in the last 24 hours: Temp:  [98.6 F (37 C)-99.2 F (37.3 C)] 98.7 F (37.1 C) (05/21 0903) Pulse Rate:  [57-69] 57  (05/21 0903) Cardiac Rhythm:  [-] Heart block (05/20 1803) Resp:  [16-20] 19  (05/21 0930) BP: (129-163)/(75-93) 152/93 mmHg (05/21 0930) SpO2:  [86 %-98 %] 97 % (05/21 0930) Weight:  [107.3 kg (236 lb 8.9 oz)] 107.3 kg (236 lb 8.9 oz) (05/21 0514)  Physical Exam: BP Readings from Last 1 Encounters:  05/13/12 152/93    Wt Readings from Last 1 Encounters:  05/13/12 107.3 kg (236 lb 8.9 oz)    Weight change: 5.1 kg (11 lb 3.9 oz)  HEENT: [place]/AT, Eyes-Brown, PERL, EOMI, Conjunctiva-Pink, Sclera-Non-icteric Neck: No JVD, No bruit, Trachea midline. Lungs:  Clear, Bilateral. Cardiac:  Regular rhythm, normal S1 and S2, no S3.  Abdomen:  Soft, non-tender. Extremities:  No edema present. No cyanosis. No clubbing. CNS: AxOx3, Cranial nerves grossly intact, moves all 4 extremities. Right handed. Skin: Warm and dry.   Intake/Output from previous day: 05/20 0701 - 05/21 0700 In: 1412 [P.O.:1044; I.V.:8; IV Piggyback:300] Out: 1575 [Urine:1575]    Lab Results: BMET    Component Value Date/Time   NA 140 05/13/2012 0640   K 3.8 05/13/2012 0640   CL 101 05/13/2012 0640   CO2 30 05/13/2012 0640   GLUCOSE 97 05/13/2012 0640   BUN 16 05/13/2012 0640   CREATININE 1.38* 05/13/2012 0640   CALCIUM 9.4 05/13/2012 0640   GFRNONAA 56* 05/13/2012 0640   GFRAA 65* 05/13/2012 0640   CBC    Component Value Date/Time   WBC 8.3 05/11/2012 1621   RBC 5.07 05/11/2012 1621   HGB 15.7 05/11/2012 1621   HCT 44.7 05/11/2012 1621   PLT 220 05/11/2012 1621   MCV 88.2 05/11/2012 1621   MCH 31.0 05/11/2012 1621   MCHC 35.1 05/11/2012 1621   RDW 14.5 05/11/2012 1621   LYMPHSABS 3.3 04/28/2009 1429   MONOABS 0.5 04/28/2009 1429   EOSABS 0.1 04/28/2009 1429   BASOSABS 0.1 04/28/2009 1429   CARDIAC ENZYMES No  results found for this basename: CKTOTAL, CKMB, CKMBINDEX, TROPONINI    Assessment/Plan:  Patient Active Hospital Problem List: CAP (community acquired pneumonia) (05/10/2012) 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 Patient understood procedure and risks and agrees for the procedure.   LOS: 3 days    Orpah Cobb  MD  05/13/2012, 9:48 AM

## 2012-05-13 NOTE — Progress Notes (Signed)
9562 off to endoscopy dept for TEE with  Toniann Fail ,rn . Pt AAO x3. Went by wheelchair

## 2012-05-13 NOTE — CV Procedure (Signed)
INDICATIONS:   The patient is 56 years old black male with mobile density near TV and RV outflow tract on Transthoracic echocardiogram.  PROCEDURE:  Informed consent was discussed including risks, benefits and alternatives for the procedure.  Risks include, but are not limited to, cough, sore throat, vomiting, nausea, somnolence, esophageal and stomach trauma or perforation, bleeding, low blood pressure, aspiration, pneumonia, infection, trauma to the teeth and death.    Patient was given sedation.  The oropharynx was anesthetized with topical lidocaine.  The transesophageal probe was inserted in the esophagus and stomach and multiple views were obtained.  Agitated saline was used after the transesophageal probe was removed from the body.  The patient was kept under observation until the patient left the procedure room.  The patient left the procedure room in stable condition.   COMPLICATIONS:  There were no immediate complications.  FINDINGS:  1. LEFT VENTRICLE: The left ventricle is normal in structure and function.  Wall motion is normal.  No thrombus or masses seen in the left ventricle.  2. RIGHT VENTRICLE:  The right ventricle is normal in structure and function without any thrombus or masses.    3. LEFT ATRIUM:  The left atrium is normal without any thrombus or masses.  4. LEFT ATRIAL APPENDAGE:  The left atrial appendage is free of any thrombus or masses.  5. RIGHT ATRIUM:  The right atrium is free of any thrombus or masses.  Large Eustachian valve is seen reaching up to Tricuspid valve  6. ATRIAL SEPTUM:  The atrial septum is normal with PFO by positive sonicated saline injection.  7. MITRAL VALVE:  The mitral valve is normal in structure and function without masses, stenosis or vegetations. Mild MR is seen.  8. TRICUSPID VALVE:  The tricuspid valve is normal in structure and function without regurgitation, masses, stenosis or vegetations.  9. AORTIC VALVE:  The aortic valve is  normal in structure and function without regurgitation, masses, stenosis or vegetations.   10. PULMONIC VALVE:  The pulmonic valve is normal in structure and function without regurgitation, masses, stenosis or vegetations.  11. AORTIC ARCH, ASCENDING AND DESCENDING AORTA:  The aorta had no atherosclerosis in the ascending or descending aorta.  The aortic arch was normal.  IMPRESSION:   Prominent Eustachian valve in RA. PFO by sonicated saline injection.  RECOMMENDATIONS:    Medical treatment.

## 2012-05-13 NOTE — Progress Notes (Signed)
  Echocardiogram Echocardiogram Transesophageal has been performed.  Jorje Guild [facility] 05/13/2012, 10:40 AM

## 2012-05-13 NOTE — Discharge Summary (Signed)
DISCHARGE SUMMARY  Lance [name]  MR#: [id number]  DOB:[birth year]  Date of Admission: 05/10/2012 Date of Discharge: 05/13/2012  Attending Physician:Neiman Roots  Patient's NWG:NFAOZ,HYQM, MD, MD  Consults: -Cardiology consult  Discharge Diagnoses: Present on Admission:  .CAP (community acquired pneumonia) Hypertension Uncontrolled Diabetes OSA Diabetic Neuropathy Hyperlipidemia Chronic Pain syndrome      Initial presentation: 56 year old male with h/o HTN, DM, OSA ON CPAP at home, came in to ED for worsening sob since yesterday, associated with productive cough, chills, subjective fevers. Denies Any chest pain, syncope, dizziness, nausea or vomiting. On arrival to ED, his mental status was altered, and ABG was done showed normal Ph with hypoxemia. He was put on oxygen and his oxygen saturations improved. He was admitted to hospitalist service for management of the CAP.    Hospital Course: 1. CAP (community acquired pneumonia) (05/10/2012)  COMPLETED  3 Days of antibiotics. His breathing has improved. He is walking in the hallway without oxygen. He is being discharged home with avelox to complete the course of the antibiotic.    2. Uncontrolled DM:  CBG (last 3)   Basename 05/13/12 1556 05/13/12 1123 05/13/12 0601  GLUCAP 98 72 90    Better controlled.  hgba1c is 12.3  Continue with lantus and SSI   3. Hypertension: better controlled.  4. Elevated probnp: probably secondary to moderate LVH found on the 2d echo. TEE  Was done to evaluate the abnormal ECHO. TEE showed prominent eustachian valve . And medical therapy is recommended.  5. Acute Renal Insufficiency: probably secondary to lisinopril which was started on admission. It was discontinued. His metformin was held, and he was recommended to get BMP done in one week at his PCP VISIT.   Medication List  As of 05/13/2012  5:01 PM   STOP taking these medications         meloxicam 15 MG tablet      metFORMIN 1000  MG tablet         TAKE these medications         atorvastatin 10 MG tablet   Commonly known as: LIPITOR   Take 10 mg by mouth daily.      busPIRone 15 MG tablet   Commonly known as: BUSPAR   Take 15 mg by mouth 2 (two) times daily.      cloNIDine 0.2 MG tablet   Commonly known as: CATAPRES   Take 0.2 mg by mouth every 8 (eight) hours.      gabapentin 300 MG capsule   Commonly known as: NEURONTIN   Take 1,200 mg by mouth 3 (three) times daily.      insulin lispro 100 UNIT/ML injection   Commonly known as: HUMALOG   Inject 4-12 Units into the skin 3 (three) times daily before meals. Sliding scale per MD      insulin NPH 100 UNIT/ML injection   Commonly known as: HUMULIN N,NOVOLIN N   Inject 20-50 Units into the skin 2 (two) times daily before a meal. Takes 20 units in am and 50 units in pm      labetalol 300 MG tablet   Commonly known as: NORMODYNE   Take 300 mg by mouth 2 (two) times daily.      LANTUS SOLOSTAR 100 UNIT/ML injection   Generic drug: insulin glargine   Inject 35 Units into the skin at bedtime. Takes 20 units in am and 50 units in pm      levofloxacin 750 MG tablet   Commonly  known as: LEVAQUIN   Take 1 tablet (750 mg total) by mouth daily.      SUBOXONE 8-2 MG Subl   Generic drug: buprenorphine-naloxone   Place 1 tablet under the tongue every 8 (eight) hours.      zolpidem 10 MG tablet   Commonly known as: AMBIEN   Take 10 mg by mouth at bedtime as needed. For sleep             Day of Discharge BP 162/89  Pulse 72  Temp(Src) 97.7 F (36.5 C) (Oral)  Resp 20  Ht 5\' 2"  (1.575 m)  Wt 107.3 kg (236 lb 8.9 oz)  BMI 43.27 kg/m2  SpO2 98%  Physical Exam:  On exam  He is alert has low grade temp  CVS S1S2 heard  Lungs: decreased at bases, no  rhonchi  Abdomen: soft , obese, NT BS+  Extremities: no pedal edema.   Results for orders placed during the hospital encounter of 05/10/12 (from the past 24 hour(s))  GLUCOSE, CAPILLARY      Status: Abnormal   Collection Time   05/12/12  9:28 PM      Component Value Range   Glucose-Capillary 133 (*) 70 - 99 (mg/dL)   Comment 1 Notify RN    GLUCOSE, CAPILLARY     Status: Normal   Collection Time   05/13/12  6:01 AM      Component Value Range   Glucose-Capillary 90  70 - 99 (mg/dL)   Comment 1 Notify RN    BASIC METABOLIC PANEL     Status: Abnormal   Collection Time   05/13/12  6:40 AM      Component Value Range   Sodium 140  135 - 145 (mEq/L)   Potassium 3.8  3.5 - 5.1 (mEq/L)   Chloride 101  96 - 112 (mEq/L)   CO2 30  19 - 32 (mEq/L)   Glucose, Bld 97  70 - 99 (mg/dL)   BUN 16  6 - 23 (mg/dL)   Creatinine, Ser 1.61 (*) 0.50 - 1.35 (mg/dL)   Calcium 9.4  8.4 - 09.6 (mg/dL)   GFR calc non Af Amer 56 (*) >90 (mL/min)   GFR calc Af Amer 65 (*) >90 (mL/min)  PROTIME-INR     Status: Normal   Collection Time   05/13/12  6:40 AM      Component Value Range   Prothrombin Time 12.8  11.6 - 15.2 (seconds)   INR 0.94  0.00 - 1.49   GLUCOSE, CAPILLARY     Status: Normal   Collection Time   05/13/12 11:23 AM      Component Value Range   Glucose-Capillary 72  70 - 99 (mg/dL)    Disposition: Home   Follow-up Appts: Discharge Orders    Future Orders Please Complete By Expires   Diet - low sodium heart healthy      Discharge instructions      Comments:   Follow up with PCP in one week.  Check BMP  In one week to check creatinine.   Activity as tolerated - No restrictions           Tests Needing Follow-up: Bmp and cxr in one week.   Time spent in discharge (includes decision making & examination of pt): 64 minutes  Signed: Kellsie Grindle 05/13/2012, 5:01 PM

## 2012-05-13 NOTE — Clinical Documentation Improvement (Signed)
RESPIRATORY FAILURE DOCUMENTATION CLARIFICATION QUERY   THIS DOCUMENT IS NOT A PERMANENT PART OF THE MEDICAL RECORD   Please update your documentation within the medical record to reflect your response to this query.                                                                                     05/13/12  Dr. Blake Divine and/or Associates,  In a better effort to capture your patient's severity of illness, reflect appropriate length of stay and utilization of resources, a review of the patient medical record has revealed the following indicators:  "On arrival to ED, his mental status was altered, and ABG was done showed normal Ph with hypoxemia. He was put on oxygen and his oxygen saturations improved."  Lance [name],Lance [name]  [phone]  05/10/2012, 4:38 PM  ABG in ED ph   7.43 pc02  44.6 p02   59.0 Bicarb  29.8  Respiratory Rate in ED on admission - 22 02 sat on room air - 91% With Bag Valve device - 94% 02 sat on room air (repeat) - 87%  "Pulmonary/Chest: He is in respiratory distress (Mild increased work of breathing). He has wheezes. He has no rales. He exhibits no tenderness and no bony tenderness.  Respiratory distress with hypercapnia and signs, and symptoms of pneumonia. Chest x-ray is consistent with pneumonia. 1. Community acquired pneumonia      2. Hypercapnia      3. Hypoxia  Hypercapnia is mild and can be treated symptomatically."  [name], MD  05/10/12 1457   Based on your clinical judgment, please document in the progress notes and discharge summary if a condition below provides greater specificity regarding the patient's respiratory status in ED:   - Acute hypoxic respiratory failure, resolved   - Acute hypercapnic respiratory failure, resolved   - Acute hypoxic and hypercapnic respiratory failure, resolved   - Other Condition   - Unable to Clinically Determine   In responding to this query please exercise your independent judgment.  The fact that a query  is asked, does not imply that any particular answer is desired or expected.    Reviewed: 05/16/12 query never addressed - ndrgi.  Mathis Dad RN  Thank You,  [name]  RN BSN Certified Clinical Documentation Specialist: Cell   [phone]   Health Information Management [place] TO RESPOND TO THE THIS QUERY, FOLLOW THE INSTRUCTIONS BELOW:  1. If needed, update documentation for the patient's encounter via the notes activity.  2. Access this query again and click edit on the In [employer].  3. After updating, or not, click F2 to complete all highlighted (required) fields concerning your review. Select "additional documentation in the medical record" OR "no additional documentation provided".  4. Click Sign note button.  5. The deficiency will fall out of your In Basket *Please let us know if you are not able to complete this workflow by phone or e-mail (listed below).

## 2012-05-14 ENCOUNTER — Encounter (HOSPITAL_COMMUNITY): Payer: Self-pay | Admitting: Cardiovascular Disease

## 2012-05-17 LAB — CULTURE, BLOOD (ROUTINE X 2)
Culture  Setup Time: 201305190126
Culture: NO GROWTH
Culture: NO GROWTH

## 2012-10-01 ENCOUNTER — Encounter: Payer: Self-pay | Admitting: Cardiology

## 2012-10-24 ENCOUNTER — Other Ambulatory Visit: Payer: Self-pay | Admitting: Internal Medicine

## 2012-10-24 DIAGNOSIS — R42 Dizziness and giddiness: Secondary | ICD-10-CM

## 2012-10-25 IMAGING — CR DG CHEST 1V PORT
2 series · 2 of 2 positions shown · non-contrast
Comparison: Chest x-ray 12/12/2010.

CLINICAL DATA: Shortness of breath.

PORTABLE CHEST - 1 VIEW

[AP (1 of 2)]
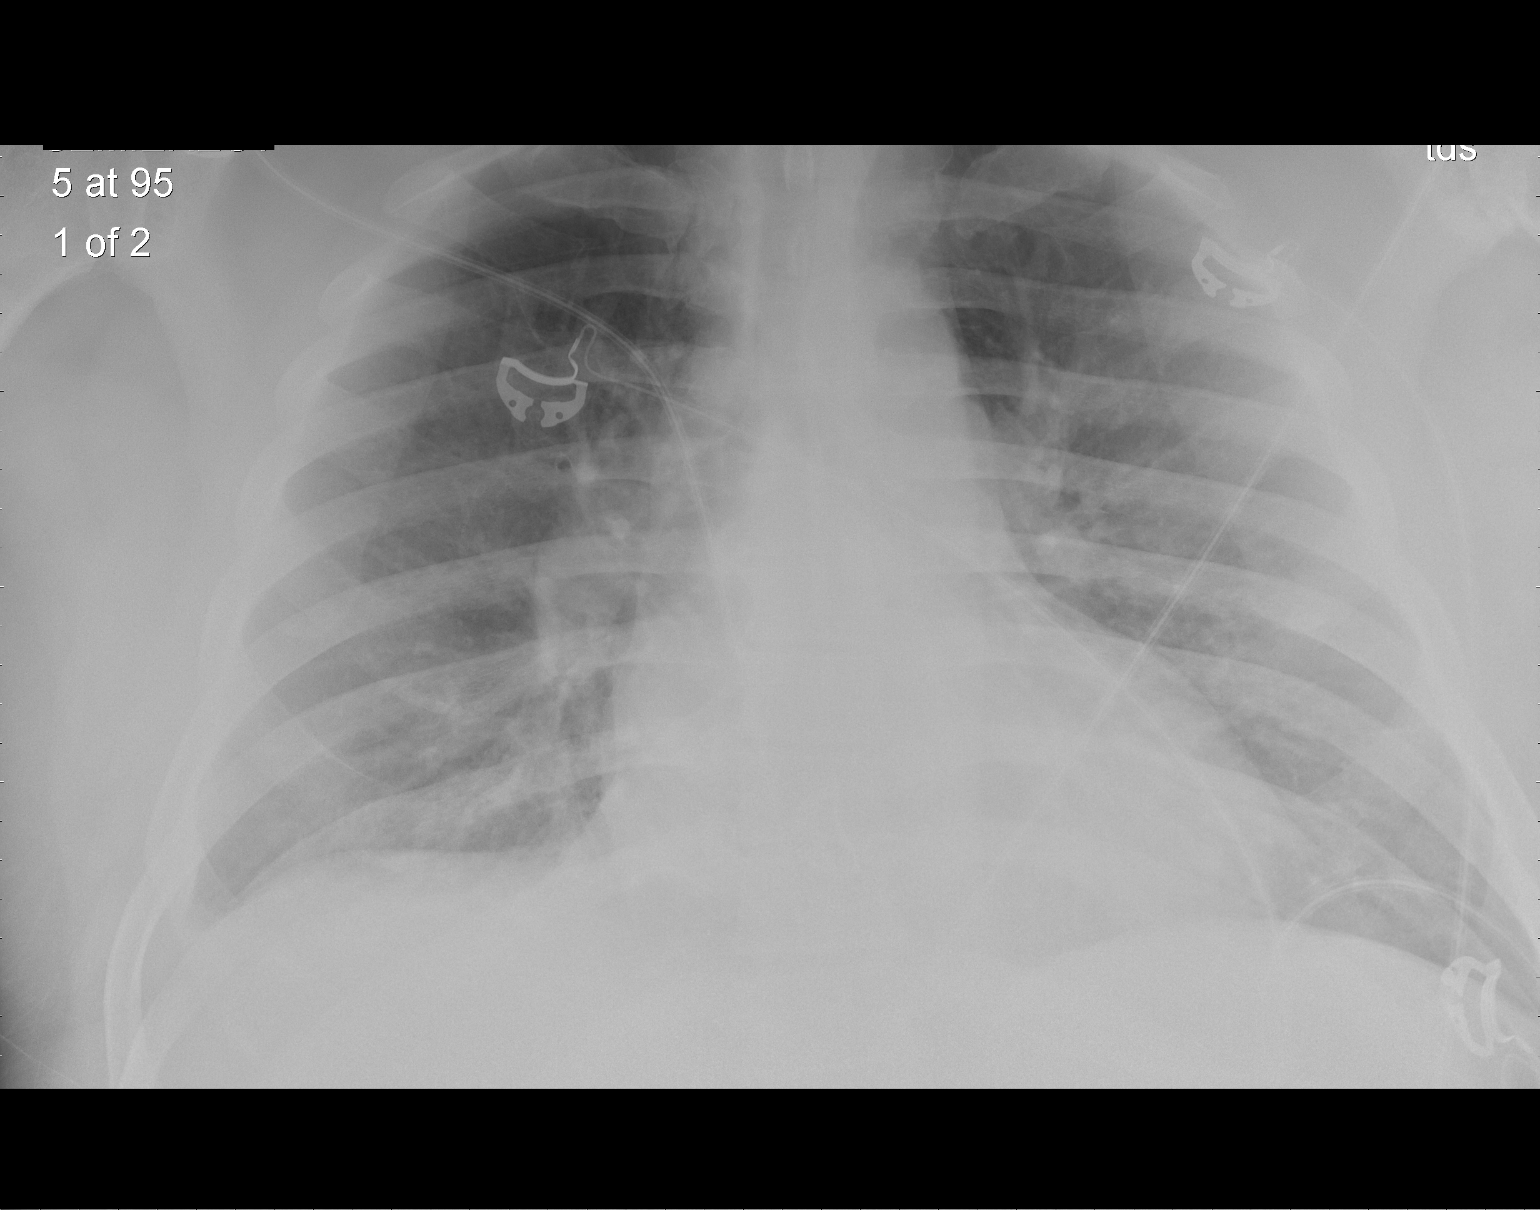

[AP (2 of 2)]
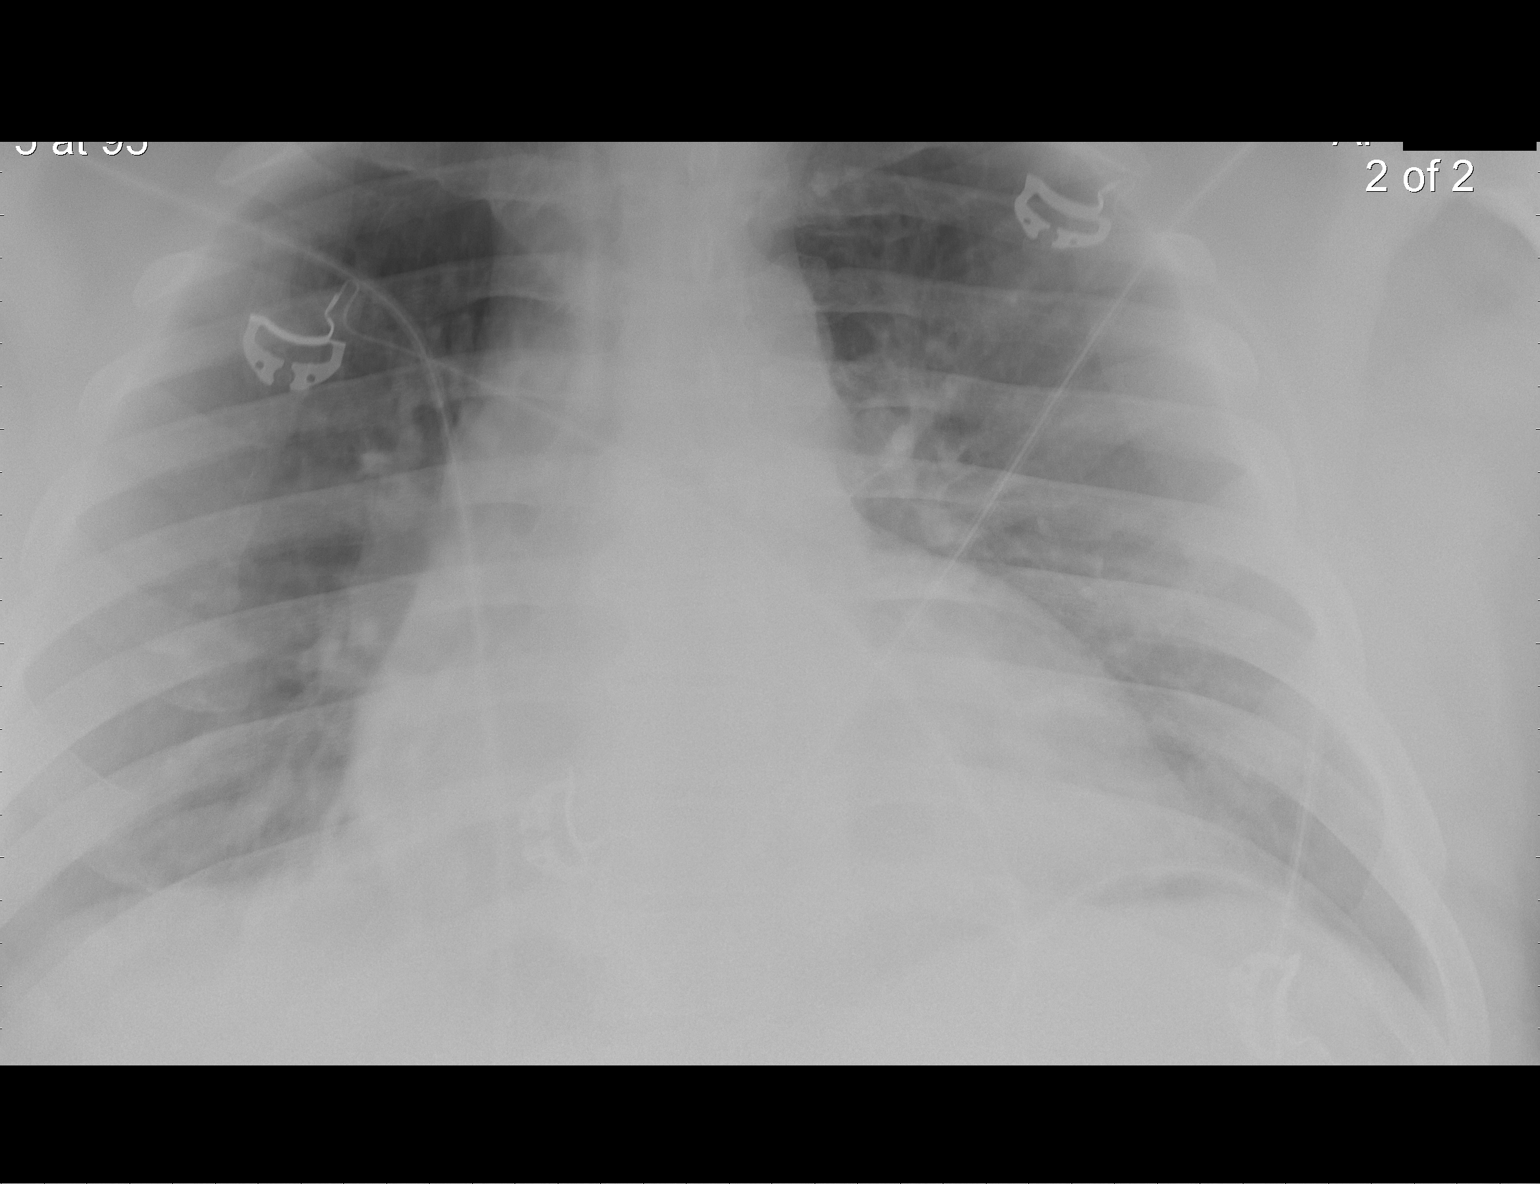

[2 of 2 positions shown; findings below may reference images not displayed]

FINDINGS: Lung volumes are normal.  There is mild cephalization of
the pulmonary vasculature without definite evidence of pulmonary
edema.  There are ill-defined bibasilar opacities (left greater
than right), which may simply reflect some areas of underlying
atelectasis, however, air space consolidation (particularly in the
left base where there is a potential for a air bronchograms) is not
excluded.  Heart size is borderline enlarged.  Mediastinal contours
are within normal limits.
IMPRESSION: 1.  Ill-defined bibasilar opacities (left greater than right) which
may reflect areas of atelectasis and/or consolidation.
2.  Mild pulmonary venous congestion without frank pulmonary edema.
3.  Borderline cardiomegaly.

## 2012-10-27 ENCOUNTER — Other Ambulatory Visit: Payer: Medicare Other

## 2012-12-09 ENCOUNTER — Other Ambulatory Visit: Payer: Self-pay | Admitting: Internal Medicine

## 2012-12-09 DIAGNOSIS — R42 Dizziness and giddiness: Secondary | ICD-10-CM

## 2012-12-15 ENCOUNTER — Ambulatory Visit
Admission: RE | Admit: 2012-12-15 | Discharge: 2012-12-15 | Disposition: A | Discharge status: Home / Self Care | Payer: Medicare Other | Source: Ambulatory Visit | Attending: Internal Medicine | Admitting: Internal Medicine

## 2012-12-15 DIAGNOSIS — R42 Dizziness and giddiness: Secondary | ICD-10-CM

## 2013-01-13 ENCOUNTER — Other Ambulatory Visit: Payer: Self-pay | Admitting: Endocrinology

## 2013-01-13 DIAGNOSIS — N289 Disorder of kidney and ureter, unspecified: Secondary | ICD-10-CM

## 2013-01-26 ENCOUNTER — Other Ambulatory Visit: Payer: Medicare Other

## 2013-01-28 ENCOUNTER — Ambulatory Visit (INDEPENDENT_AMBULATORY_CARE_PROVIDER_SITE_OTHER): Payer: Medicare Other | Admitting: Surgery

## 2013-02-27 ENCOUNTER — Ambulatory Visit (INDEPENDENT_AMBULATORY_CARE_PROVIDER_SITE_OTHER): Payer: Medicare Other | Admitting: Surgery

## 2013-03-02 ENCOUNTER — Telehealth (INDEPENDENT_AMBULATORY_CARE_PROVIDER_SITE_OTHER): Payer: Self-pay

## 2013-03-02 NOTE — Telephone Encounter (Signed)
V/M to call to reschedule for possibly tomorrow 03/03/13 with Dr. Ezzard Standing ask for [facility]

## 2013-03-11 ENCOUNTER — Ambulatory Visit (INDEPENDENT_AMBULATORY_CARE_PROVIDER_SITE_OTHER): Payer: Medicare Other | Admitting: Surgery

## 2013-03-12 ENCOUNTER — Other Ambulatory Visit (INDEPENDENT_AMBULATORY_CARE_PROVIDER_SITE_OTHER): Payer: Self-pay

## 2013-03-12 ENCOUNTER — Ambulatory Visit (INDEPENDENT_AMBULATORY_CARE_PROVIDER_SITE_OTHER): Payer: Medicare Other | Admitting: Surgery

## 2013-03-12 ENCOUNTER — Encounter (HOSPITAL_COMMUNITY): Payer: Self-pay | Admitting: Emergency Medicine

## 2013-03-12 ENCOUNTER — Emergency Department (HOSPITAL_COMMUNITY)
Admission: EM | Admit: 2013-03-12 | Discharge: 2013-03-12 | Disposition: A | Discharge status: Home / Self Care | Payer: Medicare Other | Attending: Emergency Medicine | Admitting: Emergency Medicine

## 2013-03-12 ENCOUNTER — Encounter (INDEPENDENT_AMBULATORY_CARE_PROVIDER_SITE_OTHER): Payer: Self-pay | Admitting: Surgery

## 2013-03-12 DIAGNOSIS — M5380 Other specified dorsopathies, site unspecified: Secondary | ICD-10-CM | POA: Insufficient documentation

## 2013-03-12 DIAGNOSIS — Z794 Long term (current) use of insulin: Secondary | ICD-10-CM | POA: Insufficient documentation

## 2013-03-12 DIAGNOSIS — I10 Essential (primary) hypertension: Secondary | ICD-10-CM

## 2013-03-12 DIAGNOSIS — J4489 Other specified chronic obstructive pulmonary disease: Secondary | ICD-10-CM | POA: Insufficient documentation

## 2013-03-12 DIAGNOSIS — Z79899 Other long term (current) drug therapy: Secondary | ICD-10-CM | POA: Insufficient documentation

## 2013-03-12 DIAGNOSIS — M62830 Muscle spasm of back: Secondary | ICD-10-CM

## 2013-03-12 DIAGNOSIS — W010XXA Fall on same level from slipping, tripping and stumbling without subsequent striking against object, initial encounter: Secondary | ICD-10-CM | POA: Insufficient documentation

## 2013-03-12 DIAGNOSIS — E785 Hyperlipidemia, unspecified: Secondary | ICD-10-CM | POA: Insufficient documentation

## 2013-03-12 DIAGNOSIS — E119 Type 2 diabetes mellitus without complications: Secondary | ICD-10-CM | POA: Insufficient documentation

## 2013-03-12 DIAGNOSIS — Y9289 Other specified places as the place of occurrence of the external cause: Secondary | ICD-10-CM | POA: Insufficient documentation

## 2013-03-12 DIAGNOSIS — G4730 Sleep apnea, unspecified: Secondary | ICD-10-CM | POA: Insufficient documentation

## 2013-03-12 DIAGNOSIS — F411 Generalized anxiety disorder: Secondary | ICD-10-CM | POA: Insufficient documentation

## 2013-03-12 DIAGNOSIS — F17200 Nicotine dependence, unspecified, uncomplicated: Secondary | ICD-10-CM | POA: Insufficient documentation

## 2013-03-12 DIAGNOSIS — G8929 Other chronic pain: Secondary | ICD-10-CM | POA: Insufficient documentation

## 2013-03-12 DIAGNOSIS — Z6839 Body mass index (BMI) 39.0-39.9, adult: Secondary | ICD-10-CM

## 2013-03-12 DIAGNOSIS — J449 Chronic obstructive pulmonary disease, unspecified: Secondary | ICD-10-CM | POA: Insufficient documentation

## 2013-03-12 DIAGNOSIS — Y9389 Activity, other specified: Secondary | ICD-10-CM | POA: Insufficient documentation

## 2013-03-12 DIAGNOSIS — M545 Low back pain, unspecified: Secondary | ICD-10-CM

## 2013-03-12 DIAGNOSIS — Z9889 Other specified postprocedural states: Secondary | ICD-10-CM | POA: Insufficient documentation

## 2013-03-12 DIAGNOSIS — K219 Gastro-esophageal reflux disease without esophagitis: Secondary | ICD-10-CM | POA: Insufficient documentation

## 2013-03-12 DIAGNOSIS — W009XXA Unspecified fall due to ice and snow, initial encounter: Secondary | ICD-10-CM

## 2013-03-12 DIAGNOSIS — S335XXA Sprain of ligaments of lumbar spine, initial encounter: Secondary | ICD-10-CM | POA: Insufficient documentation

## 2013-03-12 DIAGNOSIS — S39012A Strain of muscle, fascia and tendon of lower back, initial encounter: Secondary | ICD-10-CM

## 2013-03-12 DIAGNOSIS — G4733 Obstructive sleep apnea (adult) (pediatric): Secondary | ICD-10-CM

## 2013-03-12 MED ORDER — CYCLOBENZAPRINE HCL 10 MG PO TABS: 10.0000 mg | ORAL_TABLET | Freq: Three times a day (TID) | ORAL | Status: DC | PRN

## 2013-03-12 MED ORDER — NAPROXEN 500 MG PO TABS: 500.0000 mg | ORAL_TABLET | Freq: Two times a day (BID) | ORAL | Status: DC | PRN

## 2013-03-12 NOTE — ED Notes (Signed)
Pt also c/o left foot numbness from diabetic neuropathy.

## 2013-03-12 NOTE — ED Provider Notes (Signed)
History  This chart was scribed for non-physician practitioner [name], PA-C working with Lance Kras, MD, by [name], ED Scribe. This patient was seen in room WTR9/WTR9 and the patient's care was started at 10:59 PM   CSN: [id number]  Arrival date & time 03/12/13  2101   First MD Initiated Contact with Patient 03/12/13 2245      Chief Complaint  Patient presents with  . Fall    The history is provided by the patient. No language interpreter was used.   Lance [name] is a 57 y.o. male who presents to the Emergency Department complaining of lower back pain and left shoulder pain after he slipped an fell four days ago landing on his back.  He denies hitting his head or LOC.  Pt is ambulatory with pain.  Lying down makes the pain worse while sitting up makes the pain better.  Pt has taken Vicodin with no relief.  Pt has h/o chronic lower back pain and reports he is trying to get into a pain management clinic and has an appointment next week.  He reports he was in pain management before, but could not afford to continue.  He also has h/o chronic left shoulder pain.  Pt has h/o diabetes.  He has numbness to his left toes which is baseline.  He denies loss of bowel or bladder control.         Past Medical History  Diagnosis Date  . Hypertension   . Diabetes mellitus   . Hyperlipidemia   . Sleep apnea   . GERD (gastroesophageal reflux disease)   . Anxiety   . Complication of anesthesia     difficult to arouse  . Shortness of breath   . COPD (chronic obstructive pulmonary disease)     Past Surgical History  Procedure Laterality Date  . Back surgery    . Joint replacement      hip  . Tee without cardioversion  05/13/2012    Procedure: TRANSESOPHAGEAL ECHOCARDIOGRAM (TEE);  Surgeon: Ricki Rodriguez, MD;  Location: [facility] ENDOSCOPY;  Service: Cardiovascular;  Laterality: N/A;    Family History  Problem Relation Age of Onset  . Hypertension Mother   . Diabetes Mother    . Hypertension Father   . Diabetes Father   . Diabetes Sister   . Stroke Sister   . Hypertension Sister   . Diabetes Brother   . Hypertension Brother   . Hypertension Brother   . Hypertension Brother   . Diabetes Brother     History  Substance Use Topics  . Smoking status: Current Every Day Smoker -- 0.50 packs/day for 25 years    Types: Cigarettes  . Smokeless tobacco: Former Neurosurgeon  . Alcohol Use: Yes     Comment: rare      Review of Systems  Musculoskeletal: Positive for back pain (lower back pain) and arthralgias (left shoulder pain).  Neurological: Negative for syncope.  All other systems reviewed and are negative.    Allergies  Review of patient's allergies indicates no known allergies.  Home Medications   Current Outpatient Rx  Name  Route  Sig  Dispense  Refill  . amLODipine (NORVASC) 10 MG tablet      10 mg daily.          Marland Kitchen atorvastatin (LIPITOR) 10 MG tablet   Oral   Take 10 mg by mouth daily.         . buprenorphine-naloxone (SUBOXONE) 8-2 MG SUBL  Sublingual   Place 1 tablet under the tongue every 8 (eight) hours.         . busPIRone (BUSPAR) 15 MG tablet   Oral   Take 15 mg by mouth 2 (two) times daily.         . cloNIDine (CATAPRES) 0.2 MG tablet   Oral   Take 0.2 mg by mouth every 8 (eight) hours.         . gabapentin (NEURONTIN) 300 MG capsule   Oral   Take 1,200 mg by mouth 3 (three) times daily.         . insulin glargine (LANTUS SOLOSTAR) 100 UNIT/ML injection   Subcutaneous   Inject 35 Units into the skin at bedtime. Takes 20 units in am and 50 units in pm         . insulin lispro (HUMALOG) 100 UNIT/ML injection   Subcutaneous   Inject 4-12 Units into the skin 3 (three) times daily before meals. Sliding scale per MD         . labetalol (NORMODYNE) 300 MG tablet   Oral   Take 300 mg by mouth 2 (two) times daily.         . meloxicam (MOBIC) 15 MG tablet      15 mg daily.          . metFORMIN  (GLUCOPHAGE) 500 MG tablet   Oral   Take 500 mg by mouth 2 (two) times daily with a meal.         . minoxidil (LONITEN) 2.5 MG tablet   Oral   Take 2.5 mg by mouth 2 (two) times daily.          . propantheline (PROBANTHINE) 15 MG tablet      15 mg 3 (three) times daily.          . valsartan-hydrochlorothiazide (DIOVAN-HCT) 320-25 MG per tablet      1 tablet daily.          Marland Kitchen zolpidem (AMBIEN) 10 MG tablet   Oral   Take 10 mg by mouth at bedtime as needed. For sleep         . cyclobenzaprine (FLEXERIL) 10 MG tablet   Oral   Take 1 tablet (10 mg total) by mouth 3 (three) times daily as needed for muscle spasms (Take 1 tablet every 8 - 12 hours as needed for muscle spasms.).   20 tablet   0   . naproxen (NAPROSYN) 500 MG tablet   Oral   Take 1 tablet (500 mg total) by mouth 2 (two) times daily as needed.   30 tablet   0     BP 181/105  Pulse 106  Temp(Src) 98.2 F (36.8 C) (Oral)  Resp 20  SpO2 100%  Physical Exam  Nursing note and vitals reviewed. Constitutional: He is oriented to person, place, and time. He appears well-developed and well-nourished. No distress.  HENT:  Head: Normocephalic and atraumatic.  Mouth/Throat: Oropharynx is clear and moist. No oropharyngeal exudate.  Eyes: Conjunctivae are normal. Pupils are equal, round, and reactive to light.  Neck: Normal range of motion. Neck supple.  Full ROM without pain  Cardiovascular: Normal rate, regular rhythm and intact distal pulses.   Pulmonary/Chest: Effort normal and breath sounds normal. No respiratory distress. He has no wheezes.  Abdominal: Soft. He exhibits no distension. There is no tenderness.  Musculoskeletal:  Well healed surgical midline incision.  No midline tenderness.  Para spinal tenderness of the T-spine  and L-spine.    Lymphadenopathy:    He has no cervical adenopathy.  Neurological: He is alert and oriented to person, place, and time. He has normal reflexes.  Speech is clear  and goal oriented, follows commands Normal strength in upper and lower extremities bilaterally including dorsiflexion and plantar flexion, strong and equal grip strength Decreased sensation to left leg which is baseline.  Moves extremities without ataxia, coordination intact Normal gait Normal balance   Skin: Skin is warm and dry. No rash noted. He is not diaphoretic. No erythema.  Psychiatric: He has a normal mood and affect. His behavior is normal.    ED Course  Procedures   DIAGNOSTIC STUDIES: Oxygen Saturation is 100% on room air, normal by my interpretation.    COORDINATION OF CARE:  11:04 PM Discussed course of care with pt which includes antiinflammatory and muscle relaxer.  Pt understands and agrees.   Labs Reviewed - No data to display No results found.   1. Back muscle spasm   2. Low back pain   3. Strain of lumbar region, initial encounter [847.2]   4. Fall from slipping on ice, initial encounter       MDM  Mihcael Ledee presents for acute on chronic back pain after fall 4 days ago.  Patient with back pain.  Pt at neurologic baseline with decreased sensation in the LLE.   Patient can walk but states is painful and he does so without difficulty or gait disturbance.  No loss of bowel or bladder control.  No concern for cauda equina.  No fever, night sweats, weight loss, h/o cancer, IVDU.  RICE protocol and muscle relaxer indicated and discussed with patient. Pt takes suboxone therefore I will give naprosyn and robaxin, but no narcotic.  I have also discussed reasons to return immediately to the ER.  Patient expresses understanding and agrees with plan.   1. Medications: robaxin, naproxyn, vicodin, usual home medications 2. Treatment: rest, drink plenty of fluids, gentle stretching as discussed, alternate ice and heat 3. Follow Up: Please followup with your primary doctor for discussion of your diagnoses and further evaluation after today's visit; if you do not have a  primary care doctor use the resource guide provided to find one;  I personally performed the services described in this documentation, which was scribed in my presence. The recorded information has been reviewed and is accurate.       Dahlia Client Haruka Kowaleski, PA-C 03/13/13 [phone]

## 2013-03-12 NOTE — Progress Notes (Signed)
Re:   Lance [name] DOB:   Aug 02, 1956 MRN:   [id number]  Note:  Once approved for weight loss surgery, I need to see him before scheduling.  ASSESSMENT AND PLAN: 1.  Morbid obesity  Weight - 232, BMI - 39.2  Per the 1991 NIH Consensus Statement, the patient is a candidate for bariatric surgery.  The patient attended our initial information session and reviewed the types of bariatric surgery.    The patient is interested in the Roux en Y Gastric Bypass.  I discussed with the patient the indications and risks of bariatric surgery.  The potential risks of surgery include, but are not limited to, bleeding, infection, leak from the bowel, DVT and PE, open surgery, long term nutrition consequences, and death.  The patient understands the importance of compliance and long term follow-up with our group after surgery.  From here we will obtain lab tests, x-rays, nutrition consult, and psych consult.  The patient smokes and knows that he has to quit.  I also wonder about motivation in a patient who has been on disability since 1991.  I also question how well he understands the surgery.  We will go ahead and she what his workup shows.  2.  Hypertension x 20 years. 3.  Diabetes mellitus since 1988. 4.  Sleep apnea x 10 years. 5.  GERD 6.  COPD 7.  Smokes cigarettes  He smoks 1 PPD - knows that he needs to quit well before any operation. 8.  Pain issues  Was being seen at the [facility], but not now. 9.  Left leg diabetic neuropathy and shorter than right leg. 10.  Poor sleep patterns due to pain. 11.  PFO on 04/2012 echo. 12.  History of anxiety.   Chief Complaint  Patient presents with  . Bariatric Pre-op   REFERRING PHYSICIAN: Katy Apo, MD  HISTORY OF PRESENT ILLNESS: Lance [name] is a 57 y.o. (DOB: [birth year])  AA  male whose primary care physician is Katy Apo, MD and comes to me today for evaluation for bariatric surgery.  He is interested in the RYGB.  His  daughter, Trigger Frasier, had this surgery in [place], but she lives here.  The patient has been to an information session, but is unsure who spoke at the session. Dr. Lucianne Muss has encouraged him to have the surgery for weight loss. It is a little difficult for me to determine whether his weight loss surgery is externally or internally driven. He has tried multiple diets including, Weight Watchers, Nutrisystem, low-calorie, and other diets. He tried a diet pill, but can remember the name. He's had little success with any diet. His daughter, who is blind from a medical accident, had a Roux-en-Y gastric bypass in [place] in 2005. She has had good weight loss. She lives in [place], but went to [place] because her friend.    Past Medical History  Diagnosis Date  . Hypertension   . Diabetes mellitus   . Hyperlipidemia   . Sleep apnea   . GERD (gastroesophageal reflux disease)   . Anxiety   . Complication of anesthesia     difficult to arouse  . Shortness of breath   . COPD (chronic obstructive pulmonary disease)       Past Surgical History  Procedure Laterality Date  . Back surgery    . Joint replacement      hip  . Tee without cardioversion  05/13/2012    Procedure: TRANSESOPHAGEAL ECHOCARDIOGRAM (TEE);  Surgeon: Viviano Simas  Roseanne Kaufman, MD;  Location: [facility] ENDOSCOPY;  Service: Cardiovascular;  Laterality: N/A;      Current Outpatient Prescriptions  Medication Sig Dispense Refill  . amLODipine (NORVASC) 10 MG tablet       . atorvastatin (LIPITOR) 10 MG tablet Take 10 mg by mouth daily.      . buprenorphine-naloxone (SUBOXONE) 8-2 MG SUBL Place 1 tablet under the tongue every 8 (eight) hours.      . busPIRone (BUSPAR) 15 MG tablet Take 15 mg by mouth 2 (two) times daily.      . cloNIDine (CATAPRES) 0.2 MG tablet Take 0.2 mg by mouth every 8 (eight) hours.      . gabapentin (NEURONTIN) 300 MG capsule Take 1,200 mg by mouth 3 (three) times daily.      . insulin glargine (LANTUS  SOLOSTAR) 100 UNIT/ML injection Inject 35 Units into the skin at bedtime. Takes 20 units in am and 50 units in pm      . insulin lispro (HUMALOG) 100 UNIT/ML injection Inject 4-12 Units into the skin 3 (three) times daily before meals. Sliding scale per MD      . insulin NPH (HUMULIN N,NOVOLIN N) 100 UNIT/ML injection Inject 20-50 Units into the skin 2 (two) times daily before a meal. Takes 20 units in am and 50 units in pm      . labetalol (NORMODYNE) 300 MG tablet Take 300 mg by mouth 2 (two) times daily.      . meloxicam (MOBIC) 15 MG tablet       . metFORMIN (GLUCOPHAGE) 1000 MG tablet       . minoxidil (LONITEN) 2.5 MG tablet       . propantheline (PROBANTHINE) 15 MG tablet       . valsartan-hydrochlorothiazide (DIOVAN-HCT) 320-25 MG per tablet       . zolpidem (AMBIEN) 10 MG tablet Take 10 mg by mouth at bedtime as needed. For sleep       No current facility-administered medications for this visit.     No Known Allergies  REVIEW OF SYSTEMS: Skin:  No history of rash.  No history of abnormal moles. Infection:  No history of hepatitis or HIV.  No history of MRSA. Neurologic:  Diabetic neuropathy with pain in his left leg. Cardiac:  Hypertension x 20 years.  He had TEE by Dr. Algie Coffer - found PFO. Pulmonary: Smokes 1 ppd.  Has COPD and is on CPAP.  Endocrine:  Diabetes mellitus since 1988.  He is Lantus and Humulog.   HgbA1C - 12.3 - 05/10/2012.  No thyroid disease. Gastrointestinal:  No history of stomach disease.  No history of liver disease.  Lap chole 2001.  No history of pancreas disease.  No history of colon disease.  No history of colonoscopy. Urologic:  No history of kidney stones.  No history of bladder infections. Musculoskeletal:  No history of joint or back disease. Hematologic:  Has had back surgery x 4.  Twice in [place].  He moved to [place] in 2000.  Twice here, last by Dr. Margaret Pyle - 2008.  He had right hip replaced by Dr. Renae Fickle 2005 and left hip replaced by Dr. Turner Daniels -  2010.   He says that his left leg is shorter and wears a lift in it. Psycho-social:  The patient is oriented.   He was being seen at the [facility], but they let him go Friday, for lack of payment.  He says that he is only on Neurontin (though  he had been on narcotics)  SOCIAL and FAMILY HISTORY: Married, but came by self.  I told him that I want to meet his wife before surgery. 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  PHYSICAL EXAM: BP 164/110  Pulse 91  Temp(Src) 99.4 F (37.4 C) (Oral)  Resp 18  Ht 5' 4.5" (1.638 m)  Wt 232 lb (105.235 kg)  BMI 39.22 kg/m2  SpO2 98%  General: AA M who is alert. He is short.  Mumbles some, which makes it hard to understand him at times.  HEENT: Normal. Pupils equal. Neck: Supple. No mass.  No thyroid mass. Lymph Nodes:  No supraclavicular or cervical nodes. Lungs: Clear to auscultation and symmetric breath sounds. Heart:  RRR. No murmur or rub. Abdomen: Soft. No mass. No tenderness. No hernia. Normal bowel sounds.  Scars of lap chole.  He is more apple than pear in shape. Rectal: No mass.  Guaiac negative. Extremities:  Has asymmetry to his legs. Neurologic:  Grossly intact to motor and sensory function. Psychiatric:  Behavior is normal.   DATA REVIEWED: Epic and notes.  Ovidio Kin, MD,  [facility] [address]  [address]   [place], [place]    [address] Phone:  [phone] FAX:  [phone]

## 2013-03-12 NOTE — ED Notes (Signed)
Pt states he was going out to get the trash cans and slipped on the ice and fell  This happened on Monday  Pt states he is now having pain in his both his shoulders  Pt states he has chronic pain in his back and shoulders anyway but now it is worse

## 2013-03-13 ENCOUNTER — Telehealth (INDEPENDENT_AMBULATORY_CARE_PROVIDER_SITE_OTHER): Payer: Self-pay

## 2013-03-13 NOTE — Telephone Encounter (Signed)
Patient scheduled with Dr. Algie Coffer for cardiac clearance 03/16/13@ 3:30 Patient aware

## 2013-03-16 NOTE — ED Provider Notes (Signed)
Medical screening examination/treatment/procedure(s) were performed by non-physician practitioner and as supervising physician I was immediately available for consultation/collaboration.    Taviana Westergren R Sylvana Bonk, MD 03/16/13 1026 

## 2013-03-17 ENCOUNTER — Other Ambulatory Visit (INDEPENDENT_AMBULATORY_CARE_PROVIDER_SITE_OTHER): Payer: Self-pay

## 2013-03-25 ENCOUNTER — Encounter (INDEPENDENT_AMBULATORY_CARE_PROVIDER_SITE_OTHER): Payer: Self-pay

## 2013-03-26 ENCOUNTER — Other Ambulatory Visit: Payer: Self-pay | Admitting: Pain Medicine

## 2013-03-26 DIAGNOSIS — M549 Dorsalgia, unspecified: Secondary | ICD-10-CM

## 2013-03-29 ENCOUNTER — Ambulatory Visit
Admission: RE | Admit: 2013-03-29 | Discharge: 2013-03-29 | Disposition: A | Discharge status: Home / Self Care | Payer: Medicare Other | Source: Ambulatory Visit | Attending: Pain Medicine | Admitting: Pain Medicine

## 2013-03-29 DIAGNOSIS — M549 Dorsalgia, unspecified: Secondary | ICD-10-CM

## 2013-03-29 MED ORDER — GADOBENATE DIMEGLUMINE 529 MG/ML IV SOLN
20.0000 mL | Freq: Once | INTRAVENOUS | Status: AC | PRN
  Administered 2013-03-29: 20 mL via INTRAVENOUS

## 2013-04-03 ENCOUNTER — Ambulatory Visit: Payer: Medicare Other | Admitting: *Deleted

## 2013-04-10 ENCOUNTER — Ambulatory Visit: Payer: Medicare Other | Admitting: *Deleted

## 2013-04-16 ENCOUNTER — Ambulatory Visit (HOSPITAL_COMMUNITY): Admission: RE | Admit: 2013-04-16 | Payer: Medicare Other | Source: Ambulatory Visit

## 2013-04-16 ENCOUNTER — Telehealth (INDEPENDENT_AMBULATORY_CARE_PROVIDER_SITE_OTHER): Payer: Self-pay

## 2013-04-16 NOTE — Telephone Encounter (Signed)
Calling to notify Dr. Ezzard Standing that the patient did not show for his UGI.  He was contacted by telephone, and stated he did not have the money to pay today, but would call back and reschedule.  Dr. Ezzard Standing notified.

## 2013-04-20 ENCOUNTER — Ambulatory Visit (HOSPITAL_COMMUNITY): Admit: 2013-04-20 | Payer: Medicare Other | Admitting: Surgery

## 2013-04-20 ENCOUNTER — Encounter (HOSPITAL_COMMUNITY): Payer: Self-pay

## 2013-04-20 SURGERY — BREATH TEST, FOR HELICOBACTER PYLORI

## 2013-04-22 ENCOUNTER — Other Ambulatory Visit (HOSPITAL_COMMUNITY): Payer: Self-pay | Admitting: Orthopedic Surgery

## 2013-04-22 DIAGNOSIS — M25552 Pain in left hip: Secondary | ICD-10-CM

## 2013-04-23 ENCOUNTER — Ambulatory Visit (HOSPITAL_COMMUNITY)
Admission: RE | Admit: 2013-04-23 | Discharge: 2013-04-23 | Disposition: A | Discharge status: Home / Self Care | Payer: Medicare Other | Source: Ambulatory Visit | Attending: Orthopedic Surgery | Admitting: Orthopedic Surgery

## 2013-04-23 DIAGNOSIS — R262 Difficulty in walking, not elsewhere classified: Secondary | ICD-10-CM | POA: Insufficient documentation

## 2013-04-23 DIAGNOSIS — M25552 Pain in left hip: Secondary | ICD-10-CM

## 2013-04-23 DIAGNOSIS — Z96649 Presence of unspecified artificial hip joint: Secondary | ICD-10-CM | POA: Insufficient documentation

## 2013-04-23 DIAGNOSIS — M25559 Pain in unspecified hip: Secondary | ICD-10-CM | POA: Insufficient documentation

## 2013-04-23 DIAGNOSIS — N433 Hydrocele, unspecified: Secondary | ICD-10-CM | POA: Insufficient documentation

## 2013-04-25 ENCOUNTER — Ambulatory Visit: Payer: Medicare Other | Admitting: *Deleted

## 2013-04-27 ENCOUNTER — Encounter: Payer: Self-pay | Admitting: *Deleted

## 2013-04-30 ENCOUNTER — Encounter: Payer: Medicare Other | Attending: Surgery | Admitting: *Deleted

## 2013-04-30 ENCOUNTER — Encounter: Payer: Self-pay | Admitting: *Deleted

## 2013-04-30 DIAGNOSIS — Z01818 Encounter for other preprocedural examination: Secondary | ICD-10-CM | POA: Insufficient documentation

## 2013-04-30 DIAGNOSIS — Z713 Dietary counseling and surveillance: Secondary | ICD-10-CM | POA: Insufficient documentation

## 2013-04-30 NOTE — Patient Instructions (Addendum)
   Follow Pre-Op Nutrition Goals to prepare for Gastric Bypass Surgery.   Call the [facility] at [phone] once you have been given your surgery date to enrolled in the Pre-Op Nutrition Class. You will need to attend this nutrition class 3-4 weeks prior to your surgery.   Contact Leandrew Koyanagi at [facility] for timing of visits with me and Dr. Lucianne Muss (is less than 4 weeks between appointments ok?).  Have Dr. Lucianne Muss fill out the "Medically Supervised Weight Loss Documentation" form at each visit.

## 2013-04-30 NOTE — Progress Notes (Addendum)
  Pre-Op Assessment Visit:  Pre-Operative Gastric Bypass Surgery  Medical Nutrition Therapy:  Appt start time: 1600   End time:  1715.  Patient was seen on 04/30/2013 for Pre-Operative RYGB Nutrition Assessment. Assessment and letter of approval faxed to [facility] Bariatric [facility] Program coordinator on 04/30/2013.  Approval letter sent to [facility] and will be available in the chart under the media tab.  Handouts given during visit include:  Pre-Op Goals   Bariatric Surgery Protein Shakes  Samples given during visit include:   Premier Protein Shake: 1 bottle Lot: [id number]; 01/05/14  Patient to call for Pre-Op and Post-Op Nutrition Education at the [facility] when surgery is scheduled.

## 2013-05-12 ENCOUNTER — Other Ambulatory Visit (HOSPITAL_COMMUNITY): Payer: Medicare Other

## 2013-05-14 ENCOUNTER — Observation Stay (HOSPITAL_COMMUNITY)
Admission: AD | Admit: 2013-05-14 | Discharge: 2013-05-15 | Disposition: A | Discharge status: Home / Self Care | Payer: Medicare Other | Source: Ambulatory Visit | Attending: Cardiovascular Disease | Admitting: Cardiovascular Disease

## 2013-05-14 ENCOUNTER — Inpatient Hospital Stay (HOSPITAL_COMMUNITY): Payer: Medicare Other

## 2013-05-14 DIAGNOSIS — G4733 Obstructive sleep apnea (adult) (pediatric): Secondary | ICD-10-CM | POA: Insufficient documentation

## 2013-05-14 DIAGNOSIS — J449 Chronic obstructive pulmonary disease, unspecified: Secondary | ICD-10-CM | POA: Insufficient documentation

## 2013-05-14 DIAGNOSIS — R079 Chest pain, unspecified: Principal | ICD-10-CM

## 2013-05-14 DIAGNOSIS — R0602 Shortness of breath: Secondary | ICD-10-CM | POA: Insufficient documentation

## 2013-05-14 DIAGNOSIS — J4489 Other specified chronic obstructive pulmonary disease: Secondary | ICD-10-CM | POA: Insufficient documentation

## 2013-05-14 DIAGNOSIS — F411 Generalized anxiety disorder: Secondary | ICD-10-CM | POA: Insufficient documentation

## 2013-05-14 DIAGNOSIS — Z6839 Body mass index (BMI) 39.0-39.9, adult: Secondary | ICD-10-CM | POA: Insufficient documentation

## 2013-05-14 DIAGNOSIS — Z79899 Other long term (current) drug therapy: Secondary | ICD-10-CM | POA: Insufficient documentation

## 2013-05-14 DIAGNOSIS — F17200 Nicotine dependence, unspecified, uncomplicated: Secondary | ICD-10-CM | POA: Insufficient documentation

## 2013-05-14 DIAGNOSIS — I10 Essential (primary) hypertension: Secondary | ICD-10-CM | POA: Insufficient documentation

## 2013-05-14 DIAGNOSIS — E119 Type 2 diabetes mellitus without complications: Secondary | ICD-10-CM | POA: Insufficient documentation

## 2013-05-14 LAB — COMPREHENSIVE METABOLIC PANEL
ALT: 43 U/L (ref 0–53)
AST: 41 U/L — ABNORMAL HIGH (ref 0–37)
Albumin: 3.5 g/dL (ref 3.5–5.2)
CO2: 29 mEq/L (ref 19–32)
Chloride: 99 mEq/L (ref 96–112)
GFR calc non Af Amer: 46 mL/min — ABNORMAL LOW (ref >90)
Sodium: 140 mEq/L (ref 135–145)
Total Bilirubin: 0.4 mg/dL (ref 0.3–1.2)

## 2013-05-14 LAB — CBC WITH DIFFERENTIAL/PLATELET
Basophils Absolute: 0 10*3/uL (ref 0.0–0.1)
HCT: 43.2 % (ref 39.0–52.0)
Lymphocytes Relative: 22 % (ref 12–46)
Neutro Abs: 6.7 10*3/uL (ref 1.7–7.7)
Neutrophils Relative %: 70 % (ref 43–77)
Platelets: 275 10*3/uL (ref 150–400)
RDW: 15.1 % (ref 11.5–15.5)
WBC: 9.6 10*3/uL (ref 4.0–10.5)

## 2013-05-14 LAB — PROTIME-INR
INR: 1 (ref 0.00–1.49)
Prothrombin Time: 13.1 seconds (ref 11.6–15.2)

## 2013-05-14 LAB — APTT: aPTT: 164 seconds — ABNORMAL HIGH (ref 24–37)

## 2013-05-14 MED ORDER — BUSPIRONE HCL 15 MG PO TABS
15.0000 mg | ORAL_TABLET | Freq: Two times a day (BID) | ORAL | Status: DC
  Filled 2013-05-14 (×2): qty 1

## 2013-05-14 MED ORDER — SODIUM CHLORIDE 0.9 % IJ SOLN: 3.0000 mL | INTRAMUSCULAR | Status: DC | PRN

## 2013-05-14 MED ORDER — HEPARIN BOLUS VIA INFUSION
4000.0000 [IU] | Freq: Once | INTRAVENOUS | Status: AC
  Administered 2013-05-14: 4000 [IU] via INTRAVENOUS
  Filled 2013-05-14: qty 4000

## 2013-05-14 MED ORDER — IRBESARTAN 300 MG PO TABS
300.0000 mg | ORAL_TABLET | Freq: Every day | ORAL | Status: DC
Start: 2013-05-15 — End: 2013-05-15
  Administered 2013-05-15: 300 mg via ORAL
  Filled 2013-05-14: qty 1

## 2013-05-14 MED ORDER — BUPRENORPHINE HCL-NALOXONE HCL 8-2 MG SL SUBL: 1.0000 | SUBLINGUAL_TABLET | Freq: Three times a day (TID) | SUBLINGUAL | Status: DC

## 2013-05-14 MED ORDER — VALSARTAN-HYDROCHLOROTHIAZIDE 320-25 MG PO TABS: 1.0000 | ORAL_TABLET | Freq: Every day | ORAL | Status: DC

## 2013-05-14 MED ORDER — BUSPIRONE HCL 15 MG PO TABS
15.0000 mg | ORAL_TABLET | Freq: Two times a day (BID) | ORAL | Status: DC
  Filled 2013-05-14: qty 1

## 2013-05-14 MED ORDER — SODIUM CHLORIDE 0.9 % IJ SOLN: 3.0000 mL | Freq: Two times a day (BID) | INTRAMUSCULAR | Status: DC

## 2013-05-14 MED ORDER — HEPARIN (PORCINE) IN NACL 100-0.45 UNIT/ML-% IJ SOLN
1800.0000 [IU]/h | INTRAMUSCULAR | Status: DC
  Administered 2013-05-14: 1100 [IU]/h via INTRAVENOUS
  Administered 2013-05-15: 1500 [IU]/h via INTRAVENOUS
  Filled 2013-05-14 (×3): qty 250

## 2013-05-14 MED ORDER — CLOPIDOGREL BISULFATE 75 MG PO TABS
75.0000 mg | ORAL_TABLET | Freq: Every day | ORAL | Status: DC
  Administered 2013-05-15: 75 mg via ORAL
  Filled 2013-05-14: qty 1

## 2013-05-14 MED ORDER — ASPIRIN 300 MG RE SUPP
300.0000 mg | RECTAL | Status: AC
  Filled 2013-05-14: qty 1

## 2013-05-14 MED ORDER — INSULIN GLARGINE 100 UNIT/ML ~~LOC~~ SOLN
35.0000 [IU] | Freq: Every day | SUBCUTANEOUS | Status: DC
Start: 2013-05-15 — End: 2013-05-15
  Administered 2013-05-14 – 2013-05-15 (×2): 35 [IU] via SUBCUTANEOUS
  Filled 2013-05-14 (×3): qty 0.35

## 2013-05-14 MED ORDER — ALBUTEROL SULFATE (5 MG/ML) 0.5% IN NEBU
INHALATION_SOLUTION | RESPIRATORY_TRACT | Status: AC
  Administered 2013-05-14: 2.5 mg
  Filled 2013-05-14: qty 0.5

## 2013-05-14 MED ORDER — ACETAMINOPHEN 325 MG PO TABS: 650.0000 mg | ORAL_TABLET | ORAL | Status: DC | PRN

## 2013-05-14 MED ORDER — ASPIRIN EC 81 MG PO TBEC
81.0000 mg | DELAYED_RELEASE_TABLET | Freq: Every day | ORAL | Status: DC
  Administered 2013-05-15: 81 mg via ORAL
  Filled 2013-05-14: qty 1

## 2013-05-14 MED ORDER — ALPRAZOLAM 0.25 MG PO TABS: 0.2500 mg | ORAL_TABLET | Freq: Two times a day (BID) | ORAL | Status: DC | PRN

## 2013-05-14 MED ORDER — MINOXIDIL 2.5 MG PO TABS
2.5000 mg | ORAL_TABLET | Freq: Two times a day (BID) | ORAL | Status: DC
  Administered 2013-05-14 – 2013-05-15 (×2): 2.5 mg via ORAL
  Filled 2013-05-14 (×3): qty 1

## 2013-05-14 MED ORDER — INSULIN ASPART 100 UNIT/ML ~~LOC~~ SOLN
0.0000 [IU] | Freq: Three times a day (TID) | SUBCUTANEOUS | Status: DC
  Administered 2013-05-15: 3 [IU] via SUBCUTANEOUS
  Administered 2013-05-15: 4 [IU] via SUBCUTANEOUS

## 2013-05-14 MED ORDER — BUSPIRONE HCL 5 MG PO TABS
15.0000 mg | ORAL_TABLET | Freq: Two times a day (BID) | ORAL | Status: DC
  Administered 2013-05-14 – 2013-05-15 (×2): 15 mg via ORAL
  Filled 2013-05-14 (×3): qty 3

## 2013-05-14 MED ORDER — ASPIRIN 81 MG PO CHEW
324.0000 mg | CHEWABLE_TABLET | ORAL | Status: AC
  Administered 2013-05-14: 324 mg via ORAL
  Filled 2013-05-14: qty 4

## 2013-05-14 MED ORDER — LABETALOL HCL 300 MG PO TABS
300.0000 mg | ORAL_TABLET | Freq: Two times a day (BID) | ORAL | Status: DC
  Administered 2013-05-14 – 2013-05-15 (×2): 300 mg via ORAL
  Filled 2013-05-14 (×3): qty 1

## 2013-05-14 MED ORDER — HYDROCHLOROTHIAZIDE 25 MG PO TABS
25.0000 mg | ORAL_TABLET | Freq: Every day | ORAL | Status: DC
  Administered 2013-05-15: 25 mg via ORAL
  Filled 2013-05-14: qty 1

## 2013-05-14 MED ORDER — INSULIN ASPART 100 UNIT/ML ~~LOC~~ SOLN
4.0000 [IU] | Freq: Three times a day (TID) | SUBCUTANEOUS | Status: DC
  Administered 2013-05-15 (×2): 4 [IU] via SUBCUTANEOUS

## 2013-05-14 MED ORDER — HOME MED STORE IN PYXIS
1.0000 | Freq: Three times a day (TID) | Status: DC
  Administered 2013-05-14: 1 via SUBLINGUAL
  Filled 2013-05-14 (×5): qty 1

## 2013-05-14 MED ORDER — ONDANSETRON HCL 4 MG/2ML IJ SOLN: 4.0000 mg | Freq: Four times a day (QID) | INTRAMUSCULAR | Status: DC | PRN

## 2013-05-14 MED ORDER — SODIUM CHLORIDE 0.9 % IV SOLN: 250.0000 mL | INTRAVENOUS | Status: DC | PRN

## 2013-05-14 MED ORDER — METFORMIN HCL 500 MG PO TABS
500.0000 mg | ORAL_TABLET | Freq: Two times a day (BID) | ORAL | Status: DC
Start: 2013-05-15 — End: 2013-05-15
  Administered 2013-05-14 – 2013-05-15 (×2): 500 mg via ORAL
  Filled 2013-05-14 (×3): qty 1

## 2013-05-14 MED ORDER — NITROGLYCERIN 0.4 MG SL SUBL: 0.4000 mg | SUBLINGUAL_TABLET | SUBLINGUAL | Status: DC | PRN

## 2013-05-14 MED ORDER — CLONIDINE HCL 0.2 MG PO TABS
0.2000 mg | ORAL_TABLET | Freq: Three times a day (TID) | ORAL | Status: DC
  Administered 2013-05-14: 0.2 mg via ORAL
  Filled 2013-05-14 (×5): qty 1

## 2013-05-14 MED ORDER — AMLODIPINE BESYLATE 10 MG PO TABS
10.0000 mg | ORAL_TABLET | Freq: Every day | ORAL | Status: DC
  Administered 2013-05-14 – 2013-05-15 (×2): 10 mg via ORAL
  Filled 2013-05-14 (×2): qty 1

## 2013-05-14 NOTE — H&P (Signed)
Lance [name] is an 57 y.o. male.   Chief Complaint: Chest pain and shortness of breath HPI: 57 years old male with h/o HTN, DM, OSA ON CPAP at home, has exertional dyspnea and pressure type chest pain. No fever, occasional cough. He has CPAP at home but has intermittent use due to anxiety. EKG showed non-specific T wave changes.   Past Medical History  Diagnosis Date  . Hypertension   . Diabetes mellitus   . Hyperlipidemia   . Sleep apnea   . GERD (gastroesophageal reflux disease)   . Anxiety   . Complication of anesthesia     difficult to arouse  . Shortness of breath   . COPD (chronic obstructive pulmonary disease)   . Insomnia   . Morbid obesity       Past Surgical History  Procedure Laterality Date  . Back surgery    . Joint replacement      hip  . Tee without cardioversion  05/13/2012    Procedure: TRANSESOPHAGEAL ECHOCARDIOGRAM (TEE);  Surgeon: Ricki Rodriguez, MD;  Location: [facility] ENDOSCOPY;  Service: Cardiovascular;  Laterality: N/A;    Family History  Problem Relation Age of Onset  . Hypertension Mother   . Diabetes Mother   . Hypertension Father   . Diabetes Father   . Diabetes Sister   . Stroke Sister   . Hypertension Sister   . Diabetes Brother   . Hypertension Brother   . Hypertension Brother   . Hypertension Brother   . Diabetes Brother    Social History:  reports that he has been smoking Cigarettes.  He has a 12.5 pack-year smoking history. He has quit using smokeless tobacco. He reports that he does not drink alcohol or use illicit drugs.  Allergies: No Known Allergies  Medications Prior to Admission  Medication Sig Dispense Refill  . amLODipine (NORVASC) 10 MG tablet Take 10 mg by mouth daily.       . buprenorphine-naloxone (SUBOXONE) 8-2 MG SUBL Place 1 tablet under the tongue every 8 (eight) hours.      . busPIRone (BUSPAR) 15 MG tablet Take 15 mg by mouth 2 (two) times daily.      . cloNIDine (CATAPRES) 0.2 MG tablet Take 0.2 mg by mouth every  8 (eight) hours.      . insulin glargine (LANTUS SOLOSTAR) 100 UNIT/ML injection Inject 35 Units into the skin daily with breakfast.       . insulin lispro (HUMALOG) 100 UNIT/ML injection Inject 12-24 Units into the skin 3 (three) times daily before meals. Home sliding scale      . labetalol (NORMODYNE) 300 MG tablet Take 300 mg by mouth 2 (two) times daily.      . Liraglutide (VICTOZA) 18 MG/3ML SOPN Inject 1.8 mg into the skin daily.       . meloxicam (MOBIC) 15 MG tablet Take 15 mg by mouth daily.       . metFORMIN (GLUCOPHAGE) 500 MG tablet Take 500 mg by mouth 2 (two) times daily with a meal.      . minoxidil (LONITEN) 2.5 MG tablet Take 2.5 mg by mouth 2 (two) times daily.       . pregabalin (LYRICA) 200 MG capsule Take 400 mg by mouth at bedtime.      . propantheline (PROBANTHINE) 15 MG tablet Take 15 mg by mouth 3 (three) times daily.       . Tapentadol HCl (NUCYNTA ER) 100 MG TB12 Take 2 tablets by mouth every  4 (four) hours.      . valsartan-hydrochlorothiazide (DIOVAN-HCT) 320-25 MG per tablet Take 1 tablet by mouth daily.       Marland Kitchen zolpidem (AMBIEN) 10 MG tablet Take 10 mg by mouth at bedtime as needed. For sleep        No results found for this or any previous visit (from the past 48 hour(s)). No results found.  @ROS @ The patient denies anorexia. has weight gain. No vision loss, decreased hearing, hoarseness. Has chest pain. No syncope, peripheral edema, balance deficits, abdominal pain, melena, hematochezia, severe indigestion/heartburn, hematuria, incontinence, genital sores, muscle weakness, suspicious skin lesions, transient blindness, difficulty walking, depression, unusual weight change, abnormal bleeding, enlarged lymph nodes, angioedema, and breast masses.  Blood pressure 158/91, pulse 84, temperature 99.1 F (37.3 C), temperature source Oral, resp. rate 18, height 5\' 3"  (1.6 m), weight 101.56 kg (223 lb 14.4 oz), SpO2 92.00%.  Constitutional: Patient is a well-developed  and well-nourished, in moderate respiratory distress. He is cooperative with exam. oriented x 3.  Head: Normocephalic and atraumatic  Mouth: no erythema or exudates.  Eyes: Brown eyes, PERRL, EOMI, conjunctivae normal, No scleral icterus.  Neck: Supple, Trachea midline normal ROM, No JVD, mass, thyromegaly, or carotid bruit present.  Cardiovascular: RRR, S1 normal, S2 normal, no MRG, pulses symmetric and intact bilaterally  Pulmonary/Chest: scattered rhonchi, decreased air entry at bases.  Abdominal: Soft. Non-tender, distended, bowel sounds are normal, no masses, organomegaly, or guarding present.  Musculoskeletal: No joint deformities, erythema, or stiffness, trace pedal edema Neurological: A&O x3, Strength is normal and symmetric bilaterally, cranial nerve II-XII are grossly intact, no focal motor deficit. Skin: Warm, dry and intact. No rash, cyanosis, or clubbing.  Psychiatric: Mild anxiety.   Assessment/Plan Chest pain r/o CAD COPD r/o pneumonia Shortness of breath r/o CHF DM, II Obesity Anxiety Tobacco use disorder  Admit/cycle troponin I, CXR, Home medications  Lance [name] S 05/14/2013, 7:40 PM

## 2013-05-14 NOTE — Progress Notes (Signed)
ANTICOAGULATION CONSULT NOTE - Initial Consult  Pharmacy Consult for Heparin Indication: chest pain/ACS  No Known Allergies  Patient Measurements: Height: 5\' 3"  (160 cm) Weight: 223 lb 14.4 oz (101.56 kg) IBW/kg (Calculated) : 56.9 Heparin Dosing Weight: 80.3 kg  Vital Signs: Temp: 98.1 F (36.7 C) (05/22 2011) Temp src: Oral (05/22 1833) BP: 145/86 mmHg (05/22 2011) Pulse Rate: 79 (05/22 2011)  Labs: No results found for this basename: HGB, HCT, PLT, APTT, LABPROT, INR, HEPARINUNFRC, CREATININE, CKTOTAL, CKMB, TROPONINI,  in the last 72 hours  Estimated Creatinine Clearance: 62.5 ml/min (by C-G formula based on Cr of 1.38).   Medical History: Past Medical History  Diagnosis Date  . Hypertension   . Diabetes mellitus   . Hyperlipidemia   . Sleep apnea   . GERD (gastroesophageal reflux disease)   . Anxiety   . Complication of anesthesia     difficult to arouse  . Shortness of breath   . COPD (chronic obstructive pulmonary disease)   . Insomnia   . Morbid obesity     Medications:  Scheduled:  . amLODipine  10 mg Oral Daily  . aspirin  324 mg Oral NOW   Or  . aspirin  300 mg Rectal NOW  . [START ON 05/15/2013] aspirin EC  81 mg Oral Daily  . buprenorphine-naloxone  1 tablet Sublingual Q8H  . busPIRone  15 mg Oral BID  . cloNIDine  0.2 mg Oral Q8H  . [START ON 05/15/2013] clopidogrel  75 mg Oral Q breakfast  . heparin  4,000 Units Intravenous Once  . [START ON 05/15/2013] irbesartan  300 mg Oral Daily   And  . [START ON 05/15/2013] hydrochlorothiazide  25 mg Oral Daily  . [START ON 05/15/2013] insulin aspart  0-15 Units Subcutaneous TID WC  . [START ON 05/15/2013] insulin aspart  4 Units Subcutaneous TID WC  . [START ON 05/15/2013] insulin glargine  35 Units Subcutaneous Q breakfast  . labetalol  300 mg Oral BID  . [START ON 05/15/2013] metFORMIN  500 mg Oral BID WC  . minoxidil  2.5 mg Oral BID  . sodium chloride  3 mL Intravenous Q12H    Assessment: 57 yr old  male with h/o HTN, DM. OSA on CPAP, has presented with exertional dyspnea and pressure type chest pain. He is to start heparin while cardiac enzymes tests are done.  Goal of Therapy:  Heparin level 0.3-0.7 units/ml Monitor platelets by anticoagulation protocol: Yes   Plan:  Heparin bolus 4000 units Heparin drip at 1100 units/hr Daily AM heparin level and CBC.  Eugene Garnet 05/14/2013,8:17 PM

## 2013-05-15 ENCOUNTER — Encounter (HOSPITAL_COMMUNITY): Payer: Medicare Other

## 2013-05-15 ENCOUNTER — Ambulatory Visit (HOSPITAL_COMMUNITY): Payer: Medicare Other

## 2013-05-15 ENCOUNTER — Inpatient Hospital Stay (HOSPITAL_COMMUNITY): Payer: Medicare Other

## 2013-05-15 LAB — LIPID PANEL
Cholesterol: 112 mg/dL (ref 0–200)
HDL: 21 mg/dL — ABNORMAL LOW (ref >39)
Total CHOL/HDL Ratio: 5.3 RATIO
Triglycerides: 292 mg/dL — ABNORMAL HIGH (ref <150)
VLDL: 58 mg/dL — ABNORMAL HIGH (ref 0–40)

## 2013-05-15 LAB — CBC
HCT: 39.5 % (ref 39.0–52.0)
Hemoglobin: 14.1 g/dL (ref 13.0–17.0)
WBC: 8.9 10*3/uL (ref 4.0–10.5)

## 2013-05-15 LAB — GLUCOSE, CAPILLARY: Glucose-Capillary: 188 mg/dL — ABNORMAL HIGH (ref 70–99)

## 2013-05-15 LAB — BASIC METABOLIC PANEL
CO2: 28 mEq/L (ref 19–32)
Chloride: 99 mEq/L (ref 96–112)
Glucose, Bld: 174 mg/dL — ABNORMAL HIGH (ref 70–99)
Potassium: 3.4 mEq/L — ABNORMAL LOW (ref 3.5–5.1)
Sodium: 140 mEq/L (ref 135–145)

## 2013-05-15 LAB — HEMOGLOBIN A1C: Mean Plasma Glucose: 151 mg/dL — ABNORMAL HIGH (ref <117)

## 2013-05-15 LAB — TROPONIN I: Troponin I: <0.3 ng/mL (ref <0.30)

## 2013-05-15 MED ORDER — TECHNETIUM TC 99M SESTAMIBI GENERIC - CARDIOLITE
10.0000 | Freq: Once | INTRAVENOUS | Status: AC | PRN
  Administered 2013-05-15: 10 via INTRAVENOUS

## 2013-05-15 MED ORDER — HEPARIN BOLUS VIA INFUSION
3000.0000 [IU] | Freq: Once | INTRAVENOUS | Status: AC
  Administered 2013-05-15: 3000 [IU] via INTRAVENOUS
  Filled 2013-05-15: qty 3000

## 2013-05-15 MED ORDER — TECHNETIUM TC 99M SESTAMIBI GENERIC - CARDIOLITE
30.0000 | Freq: Once | INTRAVENOUS | Status: AC | PRN
  Administered 2013-05-15: 30 via INTRAVENOUS

## 2013-05-15 MED ORDER — REGADENOSON 0.4 MG/5ML IV SOLN
INTRAVENOUS | Status: AC
  Administered 2013-05-15: 0.4 mg
  Filled 2013-05-15: qty 5

## 2013-05-15 MED ORDER — POTASSIUM CHLORIDE CRYS ER 10 MEQ PO TBCR: 10.0000 meq | EXTENDED_RELEASE_TABLET | Freq: Two times a day (BID) | ORAL | Status: AC

## 2013-05-15 MED ORDER — POTASSIUM CHLORIDE CRYS ER 10 MEQ PO TBCR
10.0000 meq | EXTENDED_RELEASE_TABLET | Freq: Two times a day (BID) | ORAL | Status: DC
  Filled 2013-05-15: qty 1

## 2013-05-15 NOTE — Progress Notes (Signed)
ANTICOAGULATION CONSULT NOTE - Initial Consult  Pharmacy Consult for Heparin Indication: chest pain/ACS  No Known Allergies  Patient Measurements: Height: 5\' 3"  (160 cm) Weight: 223 lb 14.4 oz (101.56 kg) IBW/kg (Calculated) : 56.9 Heparin Dosing Weight: 80.3 kg  Vital Signs: Temp: 98.1 F (36.7 C) (05/22 2011) Temp src: Oral (05/22 1833) BP: 145/86 mmHg (05/22 2011) Pulse Rate: 79 (05/22 2011)  Labs:  Recent Labs  05/14/13 2049 05/15/13 0300  HGB 15.4 14.1  HCT 43.2 39.5  PLT 275 241  APTT 164*  --   LABPROT 13.1 12.0  INR 1.00 0.89  HEPARINUNFRC  --  0.11*  CREATININE 1.60*  --   TROPONINI <0.30  --     Estimated Creatinine Clearance: 53.9 ml/min (by C-G formula based on Cr of 1.6).   Medical History: Past Medical History  Diagnosis Date  . Hypertension   . Diabetes mellitus   . Hyperlipidemia   . Sleep apnea   . GERD (gastroesophageal reflux disease)   . Anxiety   . Complication of anesthesia     difficult to arouse  . Shortness of breath   . COPD (chronic obstructive pulmonary disease)   . Insomnia   . Morbid obesity     Medications:  Scheduled:  . amLODipine  10 mg Oral Daily  . aspirin EC  81 mg Oral Daily  . busPIRone  15 mg Oral BID  . cloNIDine  0.2 mg Oral Q8H  . clopidogrel  75 mg Oral Q breakfast  . home med stored in pyxis  1 each Sublingual Q8H  . irbesartan  300 mg Oral Daily   And  . hydrochlorothiazide  25 mg Oral Daily  . insulin aspart  0-15 Units Subcutaneous TID WC  . insulin aspart  4 Units Subcutaneous TID WC  . insulin glargine  35 Units Subcutaneous Q breakfast  . labetalol  300 mg Oral BID  . metFORMIN  500 mg Oral BID WC  . minoxidil  2.5 mg Oral BID  . sodium chloride  3 mL Intravenous Q12H    Assessment: 57 yr old male with h/o HTN, DM. OSA on CPAP, has presented with exertional dyspnea and pressure type chest pain. Heparin level is 0.11 on 1100 units/hr . No bleeding noted   Goal of Therapy:  Heparin level  0.3-0.7 units/ml Monitor platelets by anticoagulation protocol: Yes   Plan:  Re-bolus 3000 units and increase to 1500 units/hr. Heparin level in 6 hours   Janice Coffin 05/15/2013,4:01 AM

## 2013-05-15 NOTE — Discharge Summary (Signed)
Physician Discharge Summary  Patient ID: Lance [name] MRN: [id number] DOB/AGE: May 17, 1956 57 y.o.  Admit date: 05/14/2013 Discharge date: 05/15/2013  Admission Diagnoses: Chest pain r/o CAD  COPD r/o pneumonia  Shortness of breath r/o CHF  DM, II  Obesity  Anxiety  Tobacco use disorder  Discharge Diagnoses:  Principal Problem: *Chest pain* COPD  Shortness of breath due to COPD  DM, II  Obesity  Anxiety  Tobacco use disorder   Discharged Condition: fair  Hospital Course: 57 years old male with h/o HTN, DM, OSA ON CPAP at home, had exertional dyspnea and pressure type chest pain. He has COPD with sleep apnea and has CPAP machine use at home as tolerated. His cardiac enzymes were normal and nuclear stress test was negative for reversible ischemia.  He was advised to decrease smoking over one month, then quit as soon as possible. Patient understood to decrease food intake so he can lose weight. Potassium was added for possible depletion by HCTZ use. He will be followed by primary care doctor who does pulmonary work also.  Consults: None  Significant Diagnostic Studies: labs: Normal CBC and BMET except Creatinine of 1.6.  EKG-NSR.  Nuclear stress test: 1. Negative for pharmacologic-stress induced ischemia.                                      2. Left ventricular ejection fraction 47%.   Echocardiogram: Left ventricle: The cavity size was normal. There was moderate concentric hypertrophy. Systolic function was normal. The estimated ejection fraction was in the range of 60% to 65%. Wall motion was normal; there were no regional wall motion abnormalities.   Treatments: cardiac meds: labetolol and amlodipine  Discharge Exam: Blood pressure 103/62, pulse 63, temperature 98 F (36.7 C), temperature source Oral, resp. rate 18, height 5\' 3"  (1.6 m), weight 100.699 kg (222 lb), SpO2 100.00%. Constitutional: Patient is a well-developed and well-nourished, in moderate respiratory  distress. He is cooperative with exam. oriented x 3.  Head: Normocephalic and atraumatic  Mouth: no erythema or exudates.  Eyes: Brown eyes, PERRL, EOMI, conjunctivae normal, No scleral icterus.  Neck: Supple, Trachea midline normal ROM, No JVD, mass, thyromegaly, or carotid bruit present.  Cardiovascular: RRR, S1 normal, S2 normal, no MRG, pulses symmetric and intact bilaterally  Pulmonary/Chest: scattered rhonchi, decreased air entry at bases.  Abdominal: Soft. Non-tender, distended, bowel sounds are normal, no masses, organomegaly, or guarding present.  Musculoskeletal: No joint deformities, erythema, or stiffness, trace pedal edema Neurological: A&O x3, Strength is normal and symmetric bilaterally, cranial nerve II-XII are grossly intact, no focal motor deficit.  Skin: Warm, dry and intact. No rash, cyanosis, or clubbing.  Psychiatric: Mild anxiety.    Disposition: 01-Home or Self Care   Future Appointments Provider Department Dept Phone   05/28/2013 9:30 AM Wh-Dg 1 (Fluoro) [facility] [phone]   NPO after midnight.   07/02/2013 9:30 AM Ndm-Nmch Weight Class [facility] [phone]       Medication List    TAKE these medications       amLODipine 10 MG tablet  Commonly known as:  NORVASC  Take 10 mg by mouth daily.     busPIRone 15 MG tablet  Commonly known as:  BUSPAR  Take 15 mg by mouth 2 (two) times daily.     cloNIDine 0.2 MG tablet  Commonly known  as:  CATAPRES  Take 0.2 mg by mouth every 8 (eight) hours.     insulin lispro 100 UNIT/ML injection  Commonly known as:  HUMALOG  Inject 12-24 Units into the skin 3 (three) times daily before meals. Home sliding scale     labetalol 300 MG tablet  Commonly known as:  NORMODYNE  Take 300 mg by mouth 2 (two) times daily.     LANTUS SOLOSTAR 100 UNIT/ML injection  Generic drug:  insulin glargine  Inject 35 Units into the skin daily  with breakfast.     meloxicam 15 MG tablet  Commonly known as:  MOBIC  Take 15 mg by mouth daily.     metFORMIN 500 MG tablet  Commonly known as:  GLUCOPHAGE  Take 500 mg by mouth 2 (two) times daily with a meal.     minoxidil 2.5 MG tablet  Commonly known as:  LONITEN  Take 2.5 mg by mouth 2 (two) times daily.     NUCYNTA ER 100 MG Tb12  Generic drug:  Tapentadol HCl  Take 2 tablets by mouth every 4 (four) hours.     potassium chloride 10 MEQ tablet  Commonly known as:  K-DUR,KLOR-CON  Take 1 tablet (10 mEq total) by mouth 2 (two) times daily.     pregabalin 200 MG capsule  Commonly known as:  LYRICA  Take 400 mg by mouth at bedtime.     propantheline 15 MG tablet  Commonly known as:  PROBANTHINE  Take 15 mg by mouth 3 (three) times daily.     SUBOXONE 8-2 MG Subl  Generic drug:  buprenorphine-naloxone  Place 1 tablet under the tongue every 8 (eight) hours.     valsartan-hydrochlorothiazide 320-25 MG per tablet  Commonly known as:  DIOVAN-HCT  Take 1 tablet by mouth daily.     VICTOZA 18 MG/3ML Sopn  Generic drug:  Liraglutide  Inject 1.8 mg into the skin daily.     zolpidem 10 MG tablet  Commonly known as:  AMBIEN  Take 10 mg by mouth at bedtime as needed. For sleep           Follow-up Information   Follow up with POLITE,RONALD D, MD. Schedule an appointment as soon as possible for a visit in 2 weeks.   Contact information:   [address] [place] [address] [phone]       Follow up with [facility], MD. Schedule an appointment as soon as possible for a visit in 1 month.   Contact information:   [address] [place] [address] [phone]       Signed: Ricki Rodriguez 05/15/2013, 4:23 PM

## 2013-05-15 NOTE — Progress Notes (Signed)
Pt discharged home; discharge instructions explained to pt and a copy of instructions were provided; pt verbalized understanding of instructions.

## 2013-05-15 NOTE — Progress Notes (Signed)
  Echocardiogram 2D Echocardiogram has been performed.  Lance [name] 05/15/2013, 2:34 PM

## 2013-05-15 NOTE — Progress Notes (Signed)
ANTICOAGULATION CONSULT NOTE - Follow Up Consult  Pharmacy Consult for Heparin Indication: chest pain/ACS  No Known Allergies  Patient Measurements: Height: 5\' 3"  (160 cm) Weight: 222 lb (100.699 kg) IBW/kg (Calculated) : 56.9 Heparin Dosing Weight: 80.3 kg  Vital Signs: Temp: 98 F (36.7 C) (05/23 1003) Temp src: Oral (05/23 1003) BP: 130/76 mmHg (05/23 1003) Pulse Rate: 68 (05/23 1003)  Labs:  Recent Labs  05/14/13 2049 05/15/13 0300 05/15/13 1210  HGB 15.4 14.1  --   HCT 43.2 39.5  --   PLT 275 241  --   APTT 164*  --   --   LABPROT 13.1 12.0  --   INR 1.00 0.89  --   HEPARINUNFRC  --  0.11* 0.23*  CREATININE 1.60* 1.71*  --   TROPONINI <0.30 <0.30 <0.30    Estimated Creatinine Clearance: 50.2 ml/min (by C-G formula based on Cr of 1.71).   Assessment: 57 yr old male with dyspnea and chest pressure was admitted for heparin and cyclic cardiac enzymes. Troponins negative x 3.   Anticoag: Heparin level 0.23 not quite to goal. CBC stable.  Cards: HTN,  130/76, HR 68. Noted triglycerides 292 with HLD.  Meds: Norvasc, ASA 81mg , Clonidine, Plavix, HCTZ, Avapro, labetalol, Minoxidil  Endo: DM on SSI, Lantus, metformin (hold due to Scr 1.71)  Neuro: Buspar, Suboxone (home med)  Pulm: OSA   Goal of Therapy:  Heparin level 0.3-0.7 units/ml Monitor platelets by anticoagulation protocol: Yes   Plan:  Hold metformin due to elevated Scr Consider statin + fenofibrate Increase IV heparin to 1800 units/hr and recheck in 6 hrs.    Merilynn Finland, [employer] 05/15/2013,1:19 PM

## 2013-05-28 ENCOUNTER — Other Ambulatory Visit (HOSPITAL_COMMUNITY): Payer: Medicare Other

## 2013-06-02 ENCOUNTER — Ambulatory Visit (HOSPITAL_COMMUNITY): Admission: RE | Admit: 2013-06-02 | Payer: Medicare Other | Source: Ambulatory Visit

## 2013-07-02 ENCOUNTER — Ambulatory Visit: Payer: Medicare Other

## 2013-07-05 ENCOUNTER — Emergency Department (HOSPITAL_COMMUNITY)
Admission: EM | Admit: 2013-07-05 | Discharge: 2013-07-24 | Disposition: E | Payer: Medicare Other | Attending: Emergency Medicine | Admitting: Emergency Medicine

## 2013-07-05 ENCOUNTER — Encounter (HOSPITAL_COMMUNITY): Payer: Self-pay | Admitting: Cardiology

## 2013-07-05 DIAGNOSIS — F17200 Nicotine dependence, unspecified, uncomplicated: Secondary | ICD-10-CM | POA: Insufficient documentation

## 2013-07-05 DIAGNOSIS — E119 Type 2 diabetes mellitus without complications: Secondary | ICD-10-CM | POA: Insufficient documentation

## 2013-07-05 DIAGNOSIS — R141 Gas pain: Secondary | ICD-10-CM | POA: Insufficient documentation

## 2013-07-05 DIAGNOSIS — Z79899 Other long term (current) drug therapy: Secondary | ICD-10-CM | POA: Insufficient documentation

## 2013-07-05 DIAGNOSIS — G4700 Insomnia, unspecified: Secondary | ICD-10-CM | POA: Insufficient documentation

## 2013-07-05 DIAGNOSIS — J4489 Other specified chronic obstructive pulmonary disease: Secondary | ICD-10-CM | POA: Insufficient documentation

## 2013-07-05 DIAGNOSIS — F411 Generalized anxiety disorder: Secondary | ICD-10-CM | POA: Insufficient documentation

## 2013-07-05 DIAGNOSIS — G4730 Sleep apnea, unspecified: Secondary | ICD-10-CM | POA: Insufficient documentation

## 2013-07-05 DIAGNOSIS — I469 Cardiac arrest, cause unspecified: Secondary | ICD-10-CM

## 2013-07-05 DIAGNOSIS — R142 Eructation: Secondary | ICD-10-CM | POA: Insufficient documentation

## 2013-07-05 DIAGNOSIS — J449 Chronic obstructive pulmonary disease, unspecified: Secondary | ICD-10-CM | POA: Insufficient documentation

## 2013-07-05 DIAGNOSIS — I10 Essential (primary) hypertension: Secondary | ICD-10-CM | POA: Insufficient documentation

## 2013-07-05 DIAGNOSIS — Z794 Long term (current) use of insulin: Secondary | ICD-10-CM | POA: Insufficient documentation

## 2013-07-05 DIAGNOSIS — E785 Hyperlipidemia, unspecified: Secondary | ICD-10-CM | POA: Insufficient documentation

## 2013-07-05 DIAGNOSIS — R062 Wheezing: Secondary | ICD-10-CM | POA: Insufficient documentation

## 2013-07-05 DIAGNOSIS — K219 Gastro-esophageal reflux disease without esophagitis: Secondary | ICD-10-CM | POA: Insufficient documentation

## 2013-07-05 LAB — POCT I-STAT, CHEM 8
BUN: 16 mg/dL (ref 6–23)
Chloride: 105 mEq/L (ref 96–112)
Creatinine, Ser: 1.5 mg/dL — ABNORMAL HIGH (ref 0.50–1.35)
Glucose, Bld: 382 mg/dL — ABNORMAL HIGH (ref 70–99)
Potassium: 4.3 mEq/L (ref 3.5–5.1)
Sodium: 140 mEq/L (ref 135–145)

## 2013-07-05 LAB — POCT I-STAT TROPONIN I

## 2013-07-05 MED ORDER — MAGNESIUM SULFATE 50 % IJ SOLN
INTRAMUSCULAR | Status: AC | PRN
  Administered 2013-07-05: 2 g via INTRAVENOUS

## 2013-07-05 MED ORDER — EPINEPHRINE HCL 0.1 MG/ML IJ SOSY
PREFILLED_SYRINGE | INTRAMUSCULAR | Status: AC | PRN
  Administered 2013-07-05 (×5): 1 mg via INTRAVENOUS

## 2013-07-05 MED ORDER — SODIUM BICARBONATE 8.4 % IV SOLN
INTRAVENOUS | Status: AC | PRN
  Administered 2013-07-05 (×3): 50 meq via INTRAVENOUS

## 2013-07-05 MED ORDER — CALCIUM CHLORIDE 10 % IV SOLN
INTRAVENOUS | Status: AC | PRN
  Administered 2013-07-05: 1 g via INTRAVENOUS

## 2013-07-05 MED FILL — Medication: Qty: 1 | Status: AC

## 2013-07-07 NOTE — ED Provider Notes (Signed)
History    CSN: [id number] Arrival date & time 07-27-13  1154  First MD Initiated Contact with Patient 07/27/2013 1235     Chief Complaint  Patient presents with  . Respiratory Arrest   (Consider location/radiation/quality/duration/timing/severity/associated sxs/prior Treatment) HPI Comments: LEVEL 5 CAVEAT  - UNRESPONSIVE.  57 years old male with h/o HTN, DM, OSA ON CPAP brought in to the ED in cardiac arrest. Per EMS, patient called 911 for resp distress. Upon their arrival, patient was noted to be in respiratory distress. Pt had bilateral wheezing and poor air entry, he was started on albuterol. As soon as patient got in the rig, he became unresponsive and went into a PEA arrest. IO was placed, King airway was placed,  ACLS initiated by EMS - and by the time patient arrived to ED, he had 5 rounds of epinephrine.  We continued the resuscitation effort in the ED. Per EMS, there was some peaked t wave on the monitor, so along with the epi, calcium chloride was also give. We went through 5 more cycles of CPR, and 5 mg epi and 2 amp of bicarb, calcium chloride and even magnesium given - with no ROSC. Pt had PEA and then asystole at the end. Pronounced deceased, family informed, PCP team informed.    The history is provided by medical records and the EMS personnel.   Past Medical History  Diagnosis Date  . Hypertension   . Diabetes mellitus   . Hyperlipidemia   . Sleep apnea   . GERD (gastroesophageal reflux disease)   . Anxiety   . Complication of anesthesia     difficult to arouse  . Shortness of breath   . COPD (chronic obstructive pulmonary disease)   . Insomnia   . Morbid obesity    Past Surgical History  Procedure Laterality Date  . Back surgery    . Joint replacement      hip  . Tee without cardioversion  05/13/2012    Procedure: TRANSESOPHAGEAL ECHOCARDIOGRAM (TEE);  Surgeon: Ricki Rodriguez, MD;  Location: [facility] [place] [facility] ENDOSCOPY;  Service: Cardiovascular;  Laterality: N/A;    Family History  Problem Relation Age of Onset  . Hypertension Mother   . Diabetes Mother   . Hypertension Father   . Diabetes Father   . Diabetes Sister   . Stroke Sister   . Hypertension Sister   . Diabetes Brother   . Hypertension Brother   . Hypertension Brother   . Hypertension Brother   . Diabetes Brother    History  Substance Use Topics  . Smoking status: Current Every Day Smoker -- 0.50 packs/day for 25 years    Types: Cigarettes  . Smokeless tobacco: Former Neurosurgeon  . Alcohol Use: No    Review of Systems  Unable to perform ROS: Patient unresponsive    Allergies  Review of patient's allergies indicates no known allergies.  Home Medications   Current Outpatient Rx  Name  Route  Sig  Dispense  Refill  . amLODipine (NORVASC) 10 MG tablet   Oral   Take 10 mg by mouth daily.          . buprenorphine-naloxone (SUBOXONE) 8-2 MG SUBL   Sublingual   Place 1 tablet under the tongue every 8 (eight) hours.         . busPIRone (BUSPAR) 15 MG tablet   Oral   Take 15 mg by mouth 2 (two) times daily.         . cloNIDine (CATAPRES)  0.2 MG tablet   Oral   Take 0.2 mg by mouth every 8 (eight) hours.         . insulin glargine (LANTUS SOLOSTAR) 100 UNIT/ML injection   Subcutaneous   Inject 35 Units into the skin daily with breakfast.          . insulin lispro (HUMALOG) 100 UNIT/ML injection   Subcutaneous   Inject 12-24 Units into the skin 3 (three) times daily before meals. Home sliding scale         . labetalol (NORMODYNE) 300 MG tablet   Oral   Take 300 mg by mouth 2 (two) times daily.         . Liraglutide (VICTOZA) 18 MG/3ML SOPN   Subcutaneous   Inject 1.8 mg into the skin daily.          . meloxicam (MOBIC) 15 MG tablet   Oral   Take 15 mg by mouth daily.          . metFORMIN (GLUCOPHAGE) 500 MG tablet   Oral   Take 500 mg by mouth 2 (two) times daily with a meal.         . minoxidil (LONITEN) 2.5 MG tablet   Oral   Take 2.5  mg by mouth 2 (two) times daily.          . potassium chloride (K-DUR,KLOR-CON) 10 MEQ tablet   Oral   Take 1 tablet (10 mEq total) by mouth 2 (two) times daily.   30 tablet   1   . pregabalin (LYRICA) 200 MG capsule   Oral   Take 400 mg by mouth at bedtime.         . propantheline (PROBANTHINE) 15 MG tablet   Oral   Take 15 mg by mouth 3 (three) times daily.          . Tapentadol HCl (NUCYNTA ER) 100 MG TB12   Oral   Take 2 tablets by mouth every 4 (four) hours.         . valsartan-hydrochlorothiazide (DIOVAN-HCT) 320-25 MG per tablet   Oral   Take 1 tablet by mouth daily.          Marland Kitchen zolpidem (AMBIEN) 10 MG tablet   Oral   Take 10 mg by mouth at bedtime as needed. For sleep          Wt 222 lb (100.699 kg)  BMI 39.34 kg/m2 Physical Exam  Nursing note and vitals reviewed. Constitutional: He appears well-developed.  Eyes:  Fixed and dilated  Neck: Neck supple.  King Airway  Cardiovascular:  No palpable pulse  Pulmonary/Chest:  Intubated  Abdominal: Soft. He exhibits distension.  Neurological:  gcs 3  Skin: Skin is warm and dry.    ED Course  Procedures (including critical care time) Labs Reviewed  POCT I-STAT, CHEM 8 - Abnormal; Notable for the following:    Creatinine, Ser 1.50 (*)    Glucose, Bld 382 (*)    Calcium, Ion 1.48 (*)    All other components within normal limits  POCT I-STAT TROPONIN I   No results found. No diagnosis found.  MDM  Pt comes in cardiac arrest. Has COPD, several cardiac risk factors. Pt likely went into resp arrest. No ROSC despite extended effort between the EMS team and the ED team.  No PTX per exam.  Cardiopulmonary Resuscitation (CPR) Procedure Note Directed/Performed by: Derwood Kaplan I personally directed ancillary staff and/or performed CPR in an effort to regain return  of spontaneous circulation and to maintain cardiac, neuro and systemic perfusion.    Derwood Kaplan, MD 07/07/13 [phone]

## 2013-07-24 NOTE — ED Notes (Signed)
Family at the bedside with [employer]/security along with chaplin.

## 2013-07-24 NOTE — Code Documentation (Addendum)
Pulse check, - compressions resumed. Pt remains in PEA

## 2013-07-24 NOTE — Code Documentation (Signed)
Pulse check, compressions resumed 

## 2013-07-24 NOTE — Code Documentation (Signed)
Patient time of death occurred at 1216. 

## 2013-07-24 NOTE — ED Notes (Signed)
King airway removed by Dr. Rhunette Croft

## 2013-07-24 NOTE — Code Documentation (Signed)
Pulse check, pt still noted in PEA.

## 2013-07-24 NOTE — ED Notes (Signed)
Pt to department via EMS- pt was initially c/o SOB on EMS arrival. EMS reports that wheezing was noted to bilateral lungs. Pt given albuterol and attempted to place on c-pap, pt then had seizure like activity and became unresponsive and noted to be in PEA on the monitor. Pt given 5 epi's and calcium chloride via EMS. Pt arrived with compressions in progress. King airway noted with left IO.

## 2013-07-24 NOTE — Code Documentation (Addendum)
Pulse check, compressions resumed 

## 2013-07-24 NOTE — Code Documentation (Signed)
Pulse check,  
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
## 2013-07-24 DEATH — deceased
